# Patient Record
Sex: Female | Born: 1970 | Race: White | Hispanic: No | Marital: Married | Smoking: Never smoker
Health system: Southern US, Community
[De-identification: ages and names within clinical notes are randomized; demographics above are authoritative.]

## PROBLEM LIST (undated history)

## (undated) DIAGNOSIS — K760 Fatty (change of) liver, not elsewhere classified: Secondary | ICD-10-CM

## (undated) DIAGNOSIS — R05 Cough: Secondary | ICD-10-CM

## (undated) DIAGNOSIS — E669 Obesity, unspecified: Secondary | ICD-10-CM

## (undated) DIAGNOSIS — R059 Cough, unspecified: Secondary | ICD-10-CM

## (undated) DIAGNOSIS — I10 Essential (primary) hypertension: Secondary | ICD-10-CM

## (undated) HISTORY — DX: Essential (primary) hypertension: I10

## (undated) HISTORY — DX: Cough, unspecified: R05.9

## (undated) HISTORY — DX: Fatty (change of) liver, not elsewhere classified: K76.0

## (undated) HISTORY — DX: Cough: R05

## (undated) HISTORY — PX: PILONIDAL CYST EXCISION: SHX744

## (undated) HISTORY — PX: BARIATRIC SURGERY: SHX1103

## (undated) HISTORY — DX: Obesity, unspecified: E66.9

---

## 2003-11-14 ENCOUNTER — Other Ambulatory Visit: Admission: RE | Admit: 2003-11-14 | Discharge: 2003-11-14 | Payer: Self-pay | Admitting: Obstetrics & Gynecology

## 2004-02-08 ENCOUNTER — Other Ambulatory Visit: Admission: RE | Admit: 2004-02-08 | Discharge: 2004-02-08 | Payer: Self-pay | Admitting: Obstetrics & Gynecology

## 2004-08-27 ENCOUNTER — Other Ambulatory Visit: Admission: RE | Admit: 2004-08-27 | Discharge: 2004-08-27 | Payer: Self-pay | Admitting: Obstetrics & Gynecology

## 2005-09-05 ENCOUNTER — Encounter: Admission: RE | Admit: 2005-09-05 | Discharge: 2005-09-05 | Payer: Self-pay | Admitting: Obstetrics & Gynecology

## 2006-12-02 ENCOUNTER — Ambulatory Visit: Payer: Self-pay | Admitting: Family Medicine

## 2006-12-02 DIAGNOSIS — I10 Essential (primary) hypertension: Secondary | ICD-10-CM | POA: Insufficient documentation

## 2006-12-02 HISTORY — DX: Essential (primary) hypertension: I10

## 2006-12-07 ENCOUNTER — Encounter: Payer: Self-pay | Admitting: Family Medicine

## 2006-12-08 ENCOUNTER — Telehealth (INDEPENDENT_AMBULATORY_CARE_PROVIDER_SITE_OTHER): Payer: Self-pay | Admitting: *Deleted

## 2006-12-14 ENCOUNTER — Telehealth (INDEPENDENT_AMBULATORY_CARE_PROVIDER_SITE_OTHER): Payer: Self-pay | Admitting: *Deleted

## 2007-01-25 ENCOUNTER — Ambulatory Visit: Payer: Self-pay | Admitting: Family Medicine

## 2007-01-25 DIAGNOSIS — H6980 Other specified disorders of Eustachian tube, unspecified ear: Secondary | ICD-10-CM

## 2007-01-26 ENCOUNTER — Encounter: Payer: Self-pay | Admitting: Family Medicine

## 2007-01-26 LAB — CONVERTED CEMR LAB
BUN: 12 mg/dL (ref 6–23)
Calcium: 9.7 mg/dL (ref 8.4–10.5)
Chloride: 103 meq/L (ref 96–112)
Sodium: 139 meq/L (ref 135–145)

## 2007-06-11 ENCOUNTER — Encounter: Admission: RE | Admit: 2007-06-11 | Discharge: 2007-06-11 | Payer: Self-pay | Admitting: Family Medicine

## 2007-06-11 ENCOUNTER — Ambulatory Visit: Payer: Self-pay | Admitting: Family Medicine

## 2007-06-11 LAB — CONVERTED CEMR LAB
HCT: 34.2 % — ABNORMAL LOW (ref 36.0–46.0)
Hemoglobin: 11.3 g/dL — ABNORMAL LOW (ref 12.0–15.0)
Lymphs Abs: 3.2 10*3/uL (ref 0.7–4.0)
MCHC: 33.1 g/dL (ref 30.0–36.0)
Monocytes Absolute: 0.9 10*3/uL (ref 0.1–1.0)
Neutrophils Relative %: 65 % (ref 43–77)
Platelets: 608 10*3/uL — ABNORMAL HIGH (ref 150–400)
RBC: 4.7 M/uL (ref 3.87–5.11)
WBC: 11.8 10*3/uL — ABNORMAL HIGH (ref 4.0–10.5)

## 2007-07-02 ENCOUNTER — Ambulatory Visit: Payer: Self-pay | Admitting: Family Medicine

## 2007-07-02 ENCOUNTER — Telehealth: Payer: Self-pay | Admitting: Family Medicine

## 2007-08-31 ENCOUNTER — Telehealth: Payer: Self-pay | Admitting: Family Medicine

## 2007-09-14 ENCOUNTER — Ambulatory Visit: Payer: Self-pay | Admitting: Family Medicine

## 2007-10-04 ENCOUNTER — Telehealth: Payer: Self-pay | Admitting: Family Medicine

## 2007-11-02 ENCOUNTER — Ambulatory Visit: Payer: Self-pay | Admitting: Family Medicine

## 2008-02-02 ENCOUNTER — Ambulatory Visit: Payer: Self-pay | Admitting: Family Medicine

## 2008-02-02 LAB — CONVERTED CEMR LAB
AST: 50 units/L — ABNORMAL HIGH (ref 0–37)
Albumin: 4.2 g/dL (ref 3.5–5.2)
CO2: 20 meq/L (ref 19–32)
Calcium: 9.4 mg/dL (ref 8.4–10.5)
Chloride: 105 meq/L (ref 96–112)
Cholesterol: 187 mg/dL (ref 0–200)
Creatinine, Ser: 0.84 mg/dL (ref 0.40–1.20)
Glucose, Bld: 88 mg/dL (ref 70–99)
HDL: 51 mg/dL (ref >39)
Sodium: 138 meq/L (ref 135–145)
TSH: 2.009 microintl units/mL (ref 0.350–4.50)
Total CHOL/HDL Ratio: 3.7
Triglycerides: 64 mg/dL (ref <150)
VLDL: 13 mg/dL (ref 0–40)

## 2008-02-03 LAB — CONVERTED CEMR LAB
AST: 21 units/L (ref 0–37)
Calcium: 9.3 mg/dL (ref 8.4–10.5)
Glucose, Bld: 89 mg/dL (ref 70–99)
LDL Cholesterol: 121 mg/dL — ABNORMAL HIGH (ref 0–99)
Triglycerides: 101 mg/dL (ref <150)

## 2008-03-01 ENCOUNTER — Telehealth (INDEPENDENT_AMBULATORY_CARE_PROVIDER_SITE_OTHER): Payer: Self-pay | Admitting: *Deleted

## 2008-03-01 ENCOUNTER — Encounter: Payer: Self-pay | Admitting: Family Medicine

## 2008-08-01 ENCOUNTER — Ambulatory Visit: Payer: Self-pay | Admitting: Family Medicine

## 2008-08-01 DIAGNOSIS — K7689 Other specified diseases of liver: Secondary | ICD-10-CM

## 2008-08-01 DIAGNOSIS — F4322 Adjustment disorder with anxiety: Secondary | ICD-10-CM | POA: Insufficient documentation

## 2008-08-02 ENCOUNTER — Encounter: Payer: Self-pay | Admitting: Family Medicine

## 2008-08-02 DIAGNOSIS — E669 Obesity, unspecified: Secondary | ICD-10-CM

## 2008-08-09 LAB — CONVERTED CEMR LAB
HCV Ab: NEGATIVE
Hep B C IgM: NEGATIVE

## 2008-08-10 ENCOUNTER — Encounter: Admission: RE | Admit: 2008-08-10 | Discharge: 2008-08-10 | Payer: Self-pay | Admitting: Family Medicine

## 2009-01-31 ENCOUNTER — Ambulatory Visit: Payer: Self-pay | Admitting: Family Medicine

## 2009-01-31 DIAGNOSIS — R635 Abnormal weight gain: Secondary | ICD-10-CM | POA: Insufficient documentation

## 2009-02-27 ENCOUNTER — Encounter: Payer: Self-pay | Admitting: Family Medicine

## 2009-03-01 LAB — CONVERTED CEMR LAB
Alkaline Phosphatase: 104 units/L (ref 39–117)
Calcium: 9.4 mg/dL (ref 8.4–10.5)
Cholesterol: 175 mg/dL (ref 0–200)
Glucose, Bld: 87 mg/dL (ref 70–99)
Sodium: 139 meq/L (ref 135–145)
TSH: 2.677 microintl units/mL (ref 0.350–4.500)
Total Bilirubin: 0.4 mg/dL (ref 0.3–1.2)
Total CHOL/HDL Ratio: 3.5

## 2009-03-12 ENCOUNTER — Ambulatory Visit: Payer: Self-pay | Admitting: Family Medicine

## 2009-04-10 ENCOUNTER — Ambulatory Visit: Payer: Self-pay | Admitting: Family Medicine

## 2009-04-13 ENCOUNTER — Ambulatory Visit: Payer: Self-pay | Admitting: Family Medicine

## 2009-05-11 ENCOUNTER — Ambulatory Visit: Payer: Self-pay | Admitting: Family Medicine

## 2009-05-15 ENCOUNTER — Telehealth: Payer: Self-pay | Admitting: Family Medicine

## 2009-05-30 ENCOUNTER — Ambulatory Visit: Payer: Self-pay | Admitting: Family Medicine

## 2009-05-30 DIAGNOSIS — M546 Pain in thoracic spine: Secondary | ICD-10-CM

## 2009-06-07 ENCOUNTER — Encounter: Admission: RE | Admit: 2009-06-07 | Discharge: 2009-09-05 | Payer: Self-pay | Admitting: Family Medicine

## 2009-06-14 ENCOUNTER — Telehealth: Payer: Self-pay | Admitting: Family Medicine

## 2009-07-19 ENCOUNTER — Telehealth (INDEPENDENT_AMBULATORY_CARE_PROVIDER_SITE_OTHER): Payer: Self-pay | Admitting: *Deleted

## 2009-07-19 ENCOUNTER — Encounter: Payer: Self-pay | Admitting: Family Medicine

## 2009-07-23 ENCOUNTER — Ambulatory Visit: Payer: Self-pay | Admitting: Family Medicine

## 2009-07-25 ENCOUNTER — Encounter: Admission: RE | Admit: 2009-07-25 | Discharge: 2009-07-25 | Payer: Self-pay | Admitting: Obstetrics & Gynecology

## 2009-08-20 ENCOUNTER — Telehealth: Payer: Self-pay | Admitting: Family Medicine

## 2009-09-04 ENCOUNTER — Ambulatory Visit: Payer: Self-pay | Admitting: Family Medicine

## 2009-10-17 ENCOUNTER — Ambulatory Visit: Payer: Self-pay | Admitting: Family Medicine

## 2009-11-16 ENCOUNTER — Ambulatory Visit: Payer: Self-pay | Admitting: Family Medicine

## 2009-12-17 ENCOUNTER — Ambulatory Visit: Payer: Self-pay | Admitting: Family Medicine

## 2010-01-14 ENCOUNTER — Ambulatory Visit: Payer: Self-pay | Admitting: Family Medicine

## 2010-01-24 ENCOUNTER — Encounter: Payer: Self-pay | Admitting: Family Medicine

## 2010-01-24 ENCOUNTER — Ambulatory Visit: Payer: Self-pay | Admitting: Family Medicine

## 2010-03-05 NOTE — Miscellaneous (Signed)
Summary: PT Discharge/[facility]  PT Discharge/[facility]   Imported By: Lanelle Bal 07/31/2009 12:06:49  _____________________________________________________________________  External Attachment:    Type:   Image     Comment:   External Document

## 2010-03-05 NOTE — Assessment & Plan Note (Signed)
Summary: f/u WT/ BP   Vital Signs:  Patient profile:   40 year old female Height:      64.75 inches Weight:      198 pounds BMI:     33.32 O2 Sat:      98 % on Room air Pulse rate:   83 / minute BP sitting:   120 / 71  (left arm) Cuff size:   large  Vitals Entered By: Payton Spark CMA (October 17, 2009 10:38 AM)  O2 Flow:  Room air CC: F/U weight   Primary Care Karesha Trzcinski:  Seymour Bars DO  CC:  F/U weight.  History of Present Illness: 40 yo WF presents for f/u weight managment.  She has been on Phentermine for 7 months now and has lost a total of 50 lbs.  She finds that the medication has really helped her with snacking and controlling portion sizes and hunger.  her BP has remained excellent on Benicar.  She denies any adverse SEs.  She has been eating 3 meals / day with 2 healthy snacks.  She is  going to the gym 60-90 min 4-6 days/ wk.  She switched up to [employer]. She was doing elliptical, treadmill and weights.  She met with a trainer.  She feels well but always expects to 'lose more faster'.     Current Medications (verified): 1)  Benicar 20 Mg Tabs (Olmesartan Medoxomil) .Marland Kitchen.. 1 Tab By Mouth Daily 2)  Phentermine Hcl 37.5 Mg Caps (Phentermine Hcl) .Marland Kitchen.. 1 Capsule By Mouth Qam, 30 Min Before Breakfast 3)  Xyzal 5 Mg Tabs (Levocetirizine Dihydrochloride) .Marland Kitchen.. 1 Tab By Mouth Daily Pt Has Failed Claritin and Zyrtec  Allergies (verified): 1)  ! * Seasonal 2)  Ace Inhibitors  Past History:  Past Medical History: Reviewed history from 01/31/2009 and no changes required. obesity HTN G2P2 fatty liver dz  Mammogram 10-07 Pap 10-08 Dr Neal--> due for f/u  Social History: Reviewed history from 08/01/2008 and no changes required. 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 Never smoked. Occas. ETOH. Nuva Ring for birth control. Fair diet, little exercise.  Review of Systems      See HPI  Physical Exam  General:  alert,  well-developed, well-nourished, well-hydrated, and overweight-appearing.   Skin:  color normal.     Impression & Recommendations:  Problem # 1:  HYPERTENSION, BENIGN ESSENTIAL (ICD-401.1) BP at goal.  Continue Benicar.  Labs UTD.   Her updated medication list for this problem includes:    Benicar 20 Mg Tabs (Olmesartan medoxomil) .Marland Kitchen... 1 tab by mouth daily  BP today: 120/71 Prior BP: 121/79 (09/04/2009)  Prior 10 Yr Risk Heart Disease: 2 % (01/25/2007)  Labs Reviewed: K+: 4.8 (02/27/2009) Creat: : 0.99 (02/27/2009)   Chol: 175 (02/27/2009)   HDL: 50 (02/27/2009)   LDL: 104 (02/27/2009)   TG: 105 (02/27/2009)  Problem # 2:  OBESITY, UNSPECIFIED (ICD-278.00) BMI is down to 33 c/w class I obesity.  She has had a total of 50lbs lost in 7 mos on Phentermine with healthy diet and regular exercise. She wants to stay on it until her BMI drops to <30.  Reassed diet and exercise goals.    She has had complications of NAFLD and HTN. Will reasses LFTs in Jan and may be able to come off Benicar then.  Complete Medication List: 1)  Benicar 20 Mg Tabs (Olmesartan medoxomil) .Marland Kitchen.. 1 tab by mouth daily 2)  Phentermine Hcl 37.5 Mg Caps (Phentermine hcl) .Marland Kitchen.. 1 capsule by mouth qam, 30 min before breakfast 3)  Xyzal 5 Mg Tabs (Levocetirizine dihydrochloride) .Marland Kitchen.. 1 tab by mouth daily pt has failed claritin and zyrtec  Patient Instructions: 1)  Stay on Phentermine daily along with healthy (low carb) diet and exercise: 1 hr 5 days/ wk goal -- keep challenging yourself and keep up the good work! 2)  BP looks great! 3)  Nurse visits for WT/ BP checks in 1 month and 2 months. 4)  Return to see me in 3 mos.  (DEC) Prescriptions: PHENTERMINE HCL 37.5 MG CAPS (PHENTERMINE HCL) 1 capsule by mouth qAM, 30 min before breakfast  #30 x 0   Entered and Authorized by:   Seymour Bars DO   Signed by:   Seymour Bars DO on 10/17/2009   Method used:   Printed then faxed to ...       [facility]  [place] [phone][facility]  ([facility]       [address] [place], [place]  [address]       Ph: [phone] or [phone]       Fax: [phone]   RxID:   [id number]

## 2010-03-05 NOTE — Assessment & Plan Note (Signed)
Summary: weight   Vital Signs:  Patient profile:   40 year old female Height:      64.75 inches Weight:      248 pounds BMI:     41.74 O2 Sat:      99 % on Room air Temp:     98.2 degrees F oral Pulse rate:   88 / minute BP sitting:   143 / 90  (left arm) Cuff size:   large  Vitals Entered By: Payton Spark CMA (March 12, 2009 3:21 PM)  O2 Flow:  Room air CC: Discuss Weight loss   Primary Care Provider:  Seymour Bars DO  CC:  Discuss Weight loss.  History of Present Illness: 40 yo WF presents for f/u weight.  She did lose 6 lbs.  Cut out soda.   She is going to the gym 5 days/wk.  She is going 30 min of weights and 40 min on the elliptical.  She started going routinely 5 wks ago.    She is eating small more frequent meals.   She is eating more high protein.  She takes a frozen meal at lunch with rice cakes at lunch.  She making dinner at home.  She is eating dinner at 9 pm and going to bed at 10.  She is sleeping 7-8 hrs of sleep at night.  She feels well rested in the morning.    Her periods are regular on NuvaRing.   Current Medications (verified): 1)  Benicar 40 Mg Tabs (Olmesartan Medoxomil) .... 1/2 Tab By Mouth Daily 2)  Zyrtec-D Allergy & Congestion 5-120 Mg Xr12h-Tab (Cetirizine-Pseudoephedrine) .... Take 1 Tab By Mouth Once Daily 3)  Ibuprofen 200 Mg Tabs (Ibuprofen) .... 2-3 Tabs By Mouth Three Times A Day As Needed Pain  Allergies (verified): 1)  ! * Seasonal 2)  Ace Inhibitors  Past History:  Past Medical History: Reviewed history from 01/31/2009 and no changes required. obesity HTN G2P2 fatty liver dz  Mammogram 10-07 Pap 10-08 Dr Neal--> due for f/u  Social History: Reviewed history from 08/01/2008 and no changes required. 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 Never smoked. Occas. ETOH. Nuva Ring for birth control. Fair diet, little exercise.  Review of Systems      See HPI  Physical  Exam  General:  alert, well-developed, well-nourished, and well-hydrated.  obese Head:  normocephalic and atraumatic.   Mouth:  pharynx pink and moist.   Neck:  no masses.   Lungs:  Normal respiratory effort, chest expands symmetrically. Lungs are clear to auscultation, no crackles or wheezes. Heart:  Normal rate and regular rhythm. S1 and S2 normal without gallop, murmur, click, rub or other extra sounds. Neurologic:  no tremor Skin:  color normal.   Psych:  flat affect.     Impression & Recommendations:  Problem # 1:  FATTY LIVER DISEASE (ICD-571.8) LFTs mildly elevated in Jan. Copy given to pt.   Working of lifestyle changes. Start food diary.  Problem # 2:  HYPERTENSION, BENIGN ESSENTIAL (ICD-401.1) BP slightly higher today.  Continue Benicar.  Recheck in 1 month.   Her updated medication list for this problem includes:    Benicar 40 Mg Tabs (Olmesartan medoxomil) .Marland Kitchen... 1/2 tab by mouth daily  BP today: 143/90 Prior BP: 123/76 (01/31/2009)  Prior 10 Yr Risk Heart Disease: 2 % (01/25/2007)  Labs Reviewed: K+: 4.8 (02/27/2009) Creat: : 0.99 (02/27/2009)   Chol: 175 (02/27/2009)  HDL: 50 (02/27/2009)   LDL: 104 (02/27/2009)   TG: 105 (02/27/2009)  Problem # 3:  OBESITY, UNSPECIFIED (ICD-278.00) Congratulated her on 6 # wt loss.  Pt is disappointed with this given her exercise program over the past 5 wks. BMI is 41 c/w class III obesity with co-morbidities of HTN and fatty liver dz. 24hr food recall done.  She is eating her largest meal at 9 pm and going to bed an hr later. Discussed changing this to a bowl of special K at night and eating a larger meal at lunchtime. Continue 1 hr of exercise 5-6 hrs/ day and add RX Phentermine to help with wt loss. RTC 1 month for f/u wt/ BP.  Discussed goal wt loss of 1-2 lbs/ wk.  Complete Medication List: 1)  Benicar 40 Mg Tabs (Olmesartan medoxomil) .... 1/2 tab by mouth daily 2)  Zyrtec-d Allergy & Congestion 5-120 Mg Xr12h-tab  (Cetirizine-pseudoephedrine) .... Take 1 tab by mouth once daily 3)  Ibuprofen 200 Mg Tabs (Ibuprofen) .... 2-3 tabs by mouth three times a day as needed pain 4)  Phentermine Hcl 37.5 Mg Caps (Phentermine hcl) .Marland Kitchen.. 1 capsule by mouth qam, 30 min before breakfast  Patient Instructions: 1)  REturn for f/u weight mgmt in 1 month. 2)  Eat small dinner (like a bowl of special K at night). 3)  Start a Food diary. 4)  Continue 1 hr of exercise 5-6 days/ wk. Prescriptions: PHENTERMINE HCL 37.5 MG CAPS (PHENTERMINE HCL) 1 capsule by mouth qAM, 30 min before breakfast  #30 x 0   Entered and Authorized by:   Seymour Bars DO   Signed by:   Seymour Bars DO on 03/12/2009   Method used:   Reprint   RxID:   [id number] PHENTERMINE HCL 37.5 MG CAPS (PHENTERMINE HCL) 1 capsule by mouth qAM, 30 min before breakfast  #30 x 0   Entered and Authorized by:   Seymour Bars DO   Signed by:   Seymour Bars DO on 03/12/2009   Method used:   Printed then faxed to ...       [facility]  [place] [phone]       [address] [place], [place]  [address]       Ph: [phone] or [phone]       Fax: [phone]   RxID:   [id number]

## 2010-03-05 NOTE — Assessment & Plan Note (Signed)
Summary: WEIGHT BLOOD PRESS CK/DT  Nurse Visit   Vital Signs:  Patient profile:   40 year old female Weight:      230 pounds Pulse rate:   86 / minute BP sitting:   125 / 79  (left arm) Cuff size:   large  Allergies: 1)  ! * Seasonal 2)  Ace Inhibitors  Orders Added: 1)  Est. Patient Level I [616[address]]   CC: Follow-up HTN HPI: Taking meds? yes Side effects? no Chest pain, SOB, Dizziness? no A/P: HTN (401.1) At goal? yes.  If no, Patient will be notified.  5 minutes was spent with patient.

## 2010-03-05 NOTE — Progress Notes (Signed)
Summary: Phentermine refill and allergy med  Phone Note Refill Request   Refills Requested: Medication #1:  PHENTERMINE HCL 37.5 MG CAPS 1 capsule by mouth qAM Pt also c/o of seasonal allergies and would like Rx called in. She states she has tried OTC meds and does not get relief. Please advise.     Prescriptions: PHENTERMINE HCL 37.5 MG CAPS (PHENTERMINE HCL) 1 capsule by mouth qAM, 30 min before breakfast  #30 x 0   Entered and Authorized by:   Seymour Bars DO   Signed by:   Seymour Bars DO on 06/14/2009   Method used:   Printed then faxed to ...       [facility]  [place] [phone][facility] (retail)       [address] [place], [place]  [address]       Ph: [phone] or [phone]       Fax: [phone]   RxID:   [id number]  Need specifics on which OTC meds she has tried and failed for insurance to cover RX allergy meds.  Seymour Bars, D.O.  Appended Document: Phentermine refill and allergy med Pt states she has used zyrtec, nasonex, and claritin w/out any relief.  Appended Document: Phentermine refill and allergy med

## 2010-03-05 NOTE — Assessment & Plan Note (Signed)
Summary: BP and Wt check -  Nurse Visit   Vital Signs:  Patient profile:   40 year old female Height:      64.75 inches Weight:      206 pounds BMI:     34.67 Pulse rate:   77 / minute BP sitting:   121 / 79  (left arm) Cuff size:   large  Vitals Entered By: Payton Spark CMA (September 04, 2009 9:47 AM) CC: F/U weight and BP   Allergies: 1)  ! * Seasonal 2)  Ace Inhibitors  Orders Added: 1)  Est. Patient Level I [[address]] Prescriptions: PHENTERMINE HCL 37.5 MG CAPS (PHENTERMINE HCL) 1 capsule by mouth qAM, 30 min before breakfast  #30 x 0   Entered and Authorized by:   Seymour Bars DO   Signed by:   Seymour Bars DO on 09/04/2009   Method used:   Printed then faxed to ...       [facility]  [place] [phone][facility] (retail)       [address] [place], [place]  [address]       Ph: [phone] or [phone]       Fax: [phone]   RxID:   [id number]     Impression & Recommendations:  Problem # 1:  WEIGHT GAIN (ICD-783.1) Assessment Improved Wt down another 7 lbs in the past 6 mos on Phentermine. BP looks good. Will continue her for another month if she wishes. RTC in 1 month for WT/ BP check.  Complete Medication List: 1)  Benicar 20 Mg Tabs (Olmesartan medoxomil) .Marland Kitchen.. 1 tab by mouth daily 2)  Phentermine Hcl 37.5 Mg Caps (Phentermine hcl) .Marland Kitchen.. 1 capsule by mouth qam, 30 min before breakfast 3)  Xyzal 5 Mg Tabs (Levocetirizine dihydrochloride) .Marland Kitchen.. 1 tab by mouth daily pt has failed claritin and zyrtec   Appended Document: BP and Wt check - refill faxed

## 2010-03-05 NOTE — Progress Notes (Signed)
Summary: Phentermine refill  Phone Note Refill Request   Refills Requested: Medication #1:  PHENTERMINE HCL 37.5 MG CAPS 1 capsule by mouth qAM Initial call taken by: Payton Spark CMA,  May 15, 2009 8:06 AM    Prescriptions: PHENTERMINE HCL 37.5 MG CAPS (PHENTERMINE HCL) 1 capsule by mouth qAM, 30 min before breakfast  #30 x 0   Entered and Authorized by:   Seymour Bars DO   Signed by:   Seymour Bars DO on 05/15/2009   Method used:   Printed then faxed to ...       [facility]  [place] [phone][facility] (retail)       [address] [place], [place]  [address]       Ph: [phone] or [phone]       Fax: [phone]   RxID:   [id number]

## 2010-03-05 NOTE — Assessment & Plan Note (Signed)
Summary: thoracic muscle spasm   Vital Signs:  Patient profile:   40 year old female Height:      64.75 inches Weight:      224 pounds BMI:     37.70 O2 Sat:      100 % on Room air Pulse rate:   93 / minute BP sitting:   106 / 74  (left arm) Cuff size:   large  Vitals Entered By: Payton Spark CMA (May 30, 2009 10:29 AM)  O2 Flow:  Room air CC: Upper mid back pain x 2 weeks- No known injuy. Also c/o allergies and itchy dry eyes. OTC meds not helping.   Primary Care Provider:  Seymour Bars DO  CC:  Upper mid back pain x 2 weeks- No known injuy. Also c/o allergies and itchy dry eyes. OTC meds not helping.Marland Kitchen  History of Present Illness: 40 yo WF presents for R sided thoracic pain x 2 wks.  It hurts mildly during the day and is worse at night.  She has been back to the gym and has been doing some upper body lifting.  She doesn't recall an injury at home, work or at [employer]  Ibuprofen has helped just a little bit.  No previous problems like this.  Able to do her ADLs and get thru workday w/o problem.  Current Medications (verified): 1)  Benicar 40 Mg Tabs (Olmesartan Medoxomil) .... 1/2 Tab By Mouth Daily 2)  Phentermine Hcl 37.5 Mg Caps (Phentermine Hcl) .Marland Kitchen.. 1 Capsule By Mouth Qam, 30 Min Before Breakfast 3)  Zyrtec-D Allergy & Congestion 5-120 Mg Xr12h-Tab (Cetirizine-Pseudoephedrine) .... Take 1 Tab By Mouth Once Daily  Allergies (verified): 1)  ! * Seasonal 2)  Ace Inhibitors  Past History:  Past Medical History: Reviewed history from 01/31/2009 and no changes required. obesity HTN G2P2 fatty liver dz  Mammogram 10-07 Pap 10-08 Dr Neal--> due for f/u  Social History: Reviewed history from 08/01/2008 and no changes required. 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 Never smoked. Occas. ETOH. Nuva Ring for birth control. Fair diet, little exercise.  Review of Systems      See HPI  Physical Exam  General:  alert,  well-developed, well-nourished, and well-hydrated.  obese Head:  normocephalic and atraumatic.   Neck:  no masses.   Lungs:  Normal respiratory effort, chest expands symmetrically. Lungs are clear to auscultation, no crackles or wheezes. Heart:  Normal rate and regular rhythm. S1 and S2 normal without gallop, murmur, click, rub or other extra sounds. Msk:  L sided thoracic muscle fullness and tenderness along medial scapular border full L and T spine ROM full glenohumeral ROM Extremities:  no UE or LE edema Skin:  color normal.   Psych:  good eye contact, not anxious appearing, and not depressed appearing.     Impression & Recommendations:  Problem # 1:  BACK PAIN, THORACIC REGION, RIGHT (ICD-724.1) R thoracic spasm possibly with new onset weight lifting.  OK to do bicept/ tricepts but avoid other upper body weights x 2-3 wks. Treat with Cylcobenzaprine at night, RX NSAIDs, icy hot, PT and massage. Expect improvments in the next 2-3 wks. Her updated medication list for this problem includes:    Cyclobenzaprine Hcl 10 Mg Tabs (Cyclobenzaprine hcl) .Marland Kitchen... 1 tab by mouth at bedtime as needed muscle spasm    Diclofenac Sodium 50 Mg Tbec (Diclofenac sodium) .Marland Kitchen... 1 tab by mouth two times a day  with food x 10 days  Orders: Physical Therapy Referral (PT)  Problem # 2:  OBESITY, UNSPECIFIED (ICD-278.00) Weight down another 6 lbs with Phentermine + diet and exercise.  Doing great!  Complete Medication List: 1)  Benicar 40 Mg Tabs (Olmesartan medoxomil) .... 1/2 tab by mouth daily 2)  Phentermine Hcl 37.5 Mg Caps (Phentermine hcl) .Marland Kitchen.. 1 capsule by mouth qam, 30 min before breakfast 3)  Zyrtec-d Allergy & Congestion 5-120 Mg Xr12h-tab (Cetirizine-pseudoephedrine) .... Take 1 tab by mouth once daily 4)  Cyclobenzaprine Hcl 10 Mg Tabs (Cyclobenzaprine hcl) .Marland Kitchen.. 1 tab by mouth at bedtime as needed muscle spasm 5)  Diclofenac Sodium 50 Mg Tbec (Diclofenac sodium) .Marland Kitchen.. 1 tab by mouth two times a  day with food x 10 days  Patient Instructions: 1)  Try Icy Hot over muscle spasm, R thoracic region. 2)  Treat with RX anti-inflammatories- Etodolac with breakfast and dinner x 10 days. 3)  Use cylcobenzaprine at night (muscle relaxer). 4)  Refer to PT down the hall. 5)  OK to continue exercise as directed. 6)  Massage is OK to add. 7)  If not improved in 3 wks, please call. Prescriptions: DICLOFENAC SODIUM 50 MG TBEC (DICLOFENAC SODIUM) 1 tab by mouth two times a day with food x 10 days  #20 x 0   Entered and Authorized by:   Seymour Bars DO   Signed by:   Seymour Bars DO on 05/30/2009   Method used:   Electronically to        [facility] [phone]* (retail)       [address] [place], [place]  [address]       Ph: [phone] or [phone]       Fax: [phone]   RxID:   [id number] CYCLOBENZAPRINE HCL 10 MG TABS (CYCLOBENZAPRINE HCL) 1 tab by mouth at bedtime as needed muscle spasm  #15 x 0   Entered and Authorized by:   Seymour Bars DO   Signed by:   Seymour Bars DO on 05/30/2009   Method used:   Electronically to        [facility] [phone]* (retail)       [address] [place], [place]  [address]       Ph: [phone] or [phone]       Fax: [phone]   RxID:   [id number]

## 2010-03-05 NOTE — Assessment & Plan Note (Signed)
Summary: f/u weight   Vital Signs:  Patient profile:   40 year old female Height:      64.75 inches Weight:      235 pounds BMI:     39.55 O2 Sat:      96 % on Room air Temp:     98.4 degrees F oral Pulse rate:   84 / minute BP sitting:   130 / 80  (left arm) Cuff size:   large  Vitals Entered By: Payton Spark CMA (April 13, 2009 9:12 AM)  O2 Flow:  Room air CC: F/U weight.    Primary Care Provider:  Seymour Bars DO  CC:  F/U weight. .  History of Present Illness: 40 yo WF presents for f/u weight managment.  She is doing well on Phentermine which started about 3 mos ago.  It has curbed her appetite.  Her portion sizes are smaller and she is snacking less.  She is eating healthier and exercising 90 min 5 days/ wk.  Her energy level is improving.  She is sleeping well at night.  Denies tremor, insomnia or heart palpitation.    Allergies: 1)  ! * Seasonal 2)  Ace Inhibitors  Past History:  Past Medical History: Reviewed history from 01/31/2009 and no changes required. obesity HTN G2P2 fatty liver dz  Mammogram 10-07 Pap 10-08 Dr Neal--> due for f/u  Social History: Reviewed history from 08/01/2008 and no changes required. 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 Never smoked. Occas. ETOH. Nuva Ring for birth control. Fair diet, little exercise.  Review of Systems      See HPI  Physical Exam  General:  obese WF in NAD Head:  normocephalic and atraumatic.   Neck:  no thyromegaly.   Lungs:  Normal respiratory effort, chest expands symmetrically. Lungs are clear to auscultation, no crackles or wheezes. Heart:  Normal rate and regular rhythm. S1 and S2 normal without gallop, murmur, click, rub or other extra sounds. Neurologic:  no tremor Skin:  color normal.   Psych:  good eye contact, not anxious appearing, and flat affect.     Impression & Recommendations:  Problem # 1:  FATTY LIVER DISEASE (ICD-571.8) Plan to  recheck LFTs once BMI drops below 35.  Problem # 2:  HYPERTENSION, BENIGN ESSENTIAL (ICD-401.1) Assessment: Improved  Her updated medication list for this problem includes:    Benicar 40 Mg Tabs (Olmesartan medoxomil) .Marland Kitchen... 1/2 tab by mouth daily  BP today: 130/80 Prior BP: 143/90 (03/12/2009)  Prior 10 Yr Risk Heart Disease: 2 % (01/25/2007)  Labs Reviewed: K+: 4.8 (02/27/2009) Creat: : 0.99 (02/27/2009)   Chol: 175 (02/27/2009)   HDL: 50 (02/27/2009)   LDL: 104 (02/27/2009)   TG: 105 (02/27/2009)  Problem # 3:  OBESITY, UNSPECIFIED (ICD-278.00) Obesity complicated by HTN and fatty liver dz. She has done great with another 13# in the past month with phentermine, healthy diet and regular exercise and NO adverse SEs. We reviewed her food intake, exercise history and short term goals with positive reinforcement.  Complete Medication List: 1)  Benicar 40 Mg Tabs (Olmesartan medoxomil) .... 1/2 tab by mouth daily 2)  Phentermine Hcl 37.5 Mg Caps (Phentermine hcl) .Marland Kitchen.. 1 capsule by mouth qam, 30 min before breakfast  Patient Instructions: 1)  REturn for a nurse visit in 1 month - weight/ BP check. 2)  Return to see me in 3 months. Prescriptions: PHENTERMINE HCL 37.5 MG CAPS (  PHENTERMINE HCL) 1 capsule by mouth qAM, 30 min before breakfast  #30 x 0   Entered and Authorized by:   Seymour Bars DO   Signed by:   Seymour Bars DO on 04/13/2009   Method used:   Printed then faxed to ...       [facility]  [place] [phone][facility] (retail)       [address] [place], [place]  [address]       Ph: [phone] or [phone]       Fax: [phone]   RxID:   [id number]

## 2010-03-05 NOTE — Assessment & Plan Note (Signed)
Summary: BP/ WT check  Nurse Visit   Vital Signs:  Patient profile:   40 year old female Height:      64.75 inches Weight:      196 pounds BMI:     32.99 Pulse rate:   67 / minute BP sitting:   116 / 74  (right arm) Cuff size:   large  Vitals Entered By: Avon Gully CMA, Duncan Dull) (November 16, 2009 9:47 AM)  Impression & Recommendations:  Problem # 1:  WEIGHT GAIN (ICD-783.1) Wt lost in the past month: 2 # on Phentermine.  BP looks great. Will not RF since it appears that the effects of medication are wearing off. Aim for 1 hr of vigorous exercise 5-6 days / wk and 1200 kcal/ day dietary intake.  Complete Medication List: 1)  Benicar 20 Mg Tabs (Olmesartan medoxomil) .Marland Kitchen.. 1 tab by mouth daily 2)  Phentermine Hcl 37.5 Mg Caps (Phentermine hcl) .Marland Kitchen.. 1 capsule by mouth qam, 30 min before breakfast 3)  Xyzal 5 Mg Tabs (Levocetirizine dihydrochloride) .Marland Kitchen.. 1 tab by mouth daily pt has failed claritin and zyrtec   Primary Care Provider:  Seymour Bars DO   History of Present Illness: BP and Wt check    Allergies: 1)  ! * Seasonal 2)  Ace Inhibitors  Orders Added: 1)  Est. Patient Level I [[address]08]  Appended Document: BP/ WT check 11/17/09 acm 4:36  called and left pt a message with Dr. Misty Stanley reccomendations

## 2010-03-05 NOTE — Assessment & Plan Note (Signed)
Summary: WT check  Nurse Visit   Vital Signs:  Patient profile:   40 year old female Height:      64.75 inches Weight:      196 pounds BMI:     32.99 O2 Sat:      98 % on Room air Pulse rate:   86 / minute BP sitting:   116 / 74  (left arm) Cuff size:   large  Vitals Entered By: Payton Spark CMA (December 17, 2009 9:35 AM)  O2 Flow:  Room air CC: Weight and BP check. Pt feels she did better w/ phentermine and would like refill   Allergies (verified): 1)  ! * Seasonal 2)  Ace Inhibitors  Orders Added: 1)  Est. Patient Level I [10272] Prescriptions: PHENTERMINE HCL 37.5 MG CAPS (PHENTERMINE HCL) 1 capsule by mouth qAM, 30 min before breakfast  #30 x 0   Entered and Authorized by:   Seymour Bars DO   Signed by:   Seymour Bars DO on 12/17/2009   Method used:   Printed then faxed to ...       [facility] [phone][facility] (retail)       [address] [place], [place]  [address]       Ph: [phone] or [phone]       Fax: [phone]   RxID:   [id number]     Impression & Recommendations:  Problem # 1:  WEIGHT GAIN (ICD-783.1) Wt stable but not losing off Phentermine.  Had stopped it due to lack of adequate response over time. I will restart her Phentermine -- take it 5 days a wk to help prevent tolerance + healthy diet + regular exercise.  RTC for nurse visit in 5 wks -- check BP/ WT and do EKG.    Complete Medication List: 1)  Benicar 20 Mg Tabs (Olmesartan medoxomil) .Marland Kitchen.. 1 tab by mouth daily 2)  Phentermine Hcl 37.5 Mg Caps (Phentermine hcl) .Marland Kitchen.. 1 capsule by mouth qam, 30 min before breakfast 3)  Xyzal 5 Mg Tabs (Levocetirizine dihydrochloride) .Marland Kitchen.. 1 tab by mouth daily pt has failed claritin and zyrtec   Appended Document: WT check LMOM informing Pt of the above

## 2010-03-05 NOTE — Progress Notes (Signed)
Summary: Phentermine refill  Phone Note Refill Request   Refills Requested: Medication #1:  PHENTERMINE HCL 37.5 MG CAPS 1 capsule by mouth qAM Pt states when she came in for last weight check she was not due for refill until 7/16.      Prescriptions: PHENTERMINE HCL 37.5 MG CAPS (PHENTERMINE HCL) 1 capsule by mouth qAM, 30 min before breakfast  #30 x 0   Entered and Authorized by:   Seymour Bars DO   Signed by:   Seymour Bars DO on 08/20/2009   Method used:   Printed then faxed to ...       [facility]  [place] [phone]* (retail)       [address] [place], [place]  [address]       Ph: [phone] or [phone]       Fax: [phone]   RxID:   [id number]

## 2010-03-05 NOTE — Progress Notes (Signed)
  Phone Note Refill Request   Refills Requested: Medication #1:  PHENTERMINE HCL 37.5 MG CAPS 1 capsule by mouth qAM Initial call taken by: Payton Spark CMA,  July 19, 2009 8:45 AM    Prescriptions: PHENTERMINE HCL 37.5 MG CAPS (PHENTERMINE HCL) 1 capsule by mouth qAM, 30 min before breakfast  #30 x 0   Entered by:   Payton Spark CMA   Authorized by:   Seymour Bars DO   Signed by:   Payton Spark CMA on 07/19/2009   Method used:   Telephoned to ...       [facility]  [place] [phone][facility] (retail)       [address] [place], [place]  [address]       Ph: [phone] or [phone]       Fax: [phone]   RxID:   [id number]

## 2010-03-05 NOTE — Miscellaneous (Signed)
Summary: PT Initial Summary/[facility]  PT Initial Summary/   Imported By: Lanelle Bal 07/31/2009 12:05:37  _____________________________________________________________________  External Attachment:    Type:   Image     Comment:   External Document

## 2010-03-05 NOTE — Assessment & Plan Note (Signed)
Summary: f/u weight mgmt   Vital Signs:  Patient profile:   40 year old female Height:      64.75 inches Weight:      213 pounds BMI:     35.85 Pulse rate:   91 / minute BP sitting:   127 / 77  (left arm) Cuff size:   large  Vitals Entered By: Kathlene November (July 23, 2009 9:55 AM) CC: weight check with BP check   Primary Care Provider:  Seymour Bars DO  CC:  weight check with BP check.  History of Present Illness: 40 yo WF presents for f/u weight and BP.  She is doing well with Phentermine once a day.  She is on Benicar 20 mg/ day for her BP.  She has been continuing to eat healthy and is losing 1-2 lbs per wk.  She is exercising 4 days/ wk.  She feels good about her weight. She denies tremor, heart palptations or insomnia.    Current Medications (verified): 1)  Benicar 40 Mg Tabs (Olmesartan Medoxomil) .... 1/2 Tab By Mouth Daily 2)  Phentermine Hcl 37.5 Mg Caps (Phentermine Hcl) .Marland Kitchen.. 1 Capsule By Mouth Qam, 30 Min Before Breakfast 3)  Xyzal 5 Mg Tabs (Levocetirizine Dihydrochloride) .Marland Kitchen.. 1 Tab By Mouth Daily Pt Has Failed Claritin and Zyrtec  Allergies (verified): 1)  ! * Seasonal 2)  Ace Inhibitors  Comments:  Nurse/Medical Assistant: The patient's medications and allergies were reviewed with the patient and were updated in the Medication and Allergy Lists. Kathlene November (July 23, 2009 9:55 AM)  Past History:  Past Medical History: Reviewed history from 01/31/2009 and no changes required. obesity HTN G2P2 fatty liver dz  Mammogram 10-07 Pap 10-08 Dr Neal--> due for f/u  Social History: Reviewed history from 08/01/2008 and no changes required. 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 Never smoked. Occas. ETOH. Nuva Ring for birth control. Fair diet, little exercise.  Review of Systems      See HPI  Physical Exam  General:  alert, well-developed, well-nourished, and well-hydrated.  obese Head:  normocephalic and  atraumatic.   Neck:  no masses.   Lungs:  Normal respiratory effort, chest expands symmetrically. Lungs are clear to auscultation, no crackles or wheezes. Heart:  Normal rate and regular rhythm. S1 and S2 normal without gallop, murmur, click, rub or other extra sounds. Neurologic:  no tremor Skin:  color normal.   Psych:  good eye contact, not anxious appearing, and not depressed appearing.     Impression & Recommendations:  Problem # 1:  HYPERTENSION, BENIGN ESSENTIAL (ICD-401.1) BP looks great on Benicar.  Continue. Her updated medication list for this problem includes:    Benicar 20 Mg Tabs (Olmesartan medoxomil) .Marland Kitchen... 1 tab by mouth daily  BP today: 127/77 Prior BP: 106/74 (05/30/2009)  Prior 10 Yr Risk Heart Disease: 2 % (01/25/2007)  Labs Reviewed: K+: 4.8 (02/27/2009) Creat: : 0.99 (02/27/2009)   Chol: 175 (02/27/2009)   HDL: 50 (02/27/2009)   LDL: 104 (02/27/2009)   TG: 105 (02/27/2009)  Problem # 2:  OBESITY, UNSPECIFIED (ICD-278.00) Wt down another 11 lbs with healthy diet, regular exercise and Phentermine. She is doing well on the medication w/o adverse SE.   BMI is 35 c/w class II obesity.  She has co-morbidities of HTN and NAFLD.   RTC for a nurse visit in 6 wks.  Problem # 3:  FATTY LIVER DISEASE (ICD-571.8) Mildly elevated LFTs  in Jan. Will try to get her down <200 lbs prior to repeating LFTs.  Complete Medication List: 1)  Benicar 20 Mg Tabs (Olmesartan medoxomil) .Marland Kitchen.. 1 tab by mouth daily 2)  Phentermine Hcl 37.5 Mg Caps (Phentermine hcl) .Marland Kitchen.. 1 capsule by mouth qam, 30 min before breakfast 3)  Xyzal 5 Mg Tabs (Levocetirizine dihydrochloride) .Marland Kitchen.. 1 tab by mouth daily pt has failed claritin and zyrtec  Patient Instructions: 1)  Next RF due on the 16th. 2)  Keep up the good work! 3)  Return for a nurse visit BP/ Wt check in 6 wks. Prescriptions: BENICAR 20 MG TABS (OLMESARTAN MEDOXOMIL) 1 tab by mouth daily  #30 x 5   Entered and Authorized by:   Seymour Bars DO   Signed by:   Seymour Bars DO on 07/23/2009   Method used:   Electronically to        [facility] [phone]* (retail)       [address] [place], [place]  [address]       Ph: [phone] or [phone]       Fax: [phone]   RxID:   [id number]

## 2010-03-07 NOTE — Assessment & Plan Note (Signed)
Summary: f/u WT   Vital Signs:  Patient profile:   40 year old female Height:      64.75 inches Weight:      191 pounds BMI:     32.15 O2 Sat:      100 % on Room air Pulse rate:   76 / minute BP sitting:   111 / 71  (left arm) Cuff size:   large  Vitals Entered By: Payton Spark CMA (January 24, 2010 10:04 AM)  O2 Flow:  Room air CC: F/U weight and HTN   CC:  F/U weight and HTN.  Allergies: 1)  ! * Seasonal 2)  Ace Inhibitors   Impression & Recommendations:  Problem # 1:  WEIGHT GAIN (ICD-783.1) Assessment Improved Wynema is down another 5 lbs with adding Phentermine back 1 month ago.   Her BP looks great and her EKG shows NSR at 72 bpm, NSR, no arrythmia, QTc 442 ms, no ischemia. Will RF her RX for another month since she is doing great.  Has choice to continue 5 days/ wk.   Nurse visit in 1 month. OV in 2 mos.  BMI 32 = class I obesity with complication of HTN and fatty liver dz.  Complete Medication List: 1)  Benicar 20 Mg Tabs (Olmesartan medoxomil) .Marland Kitchen.. 1 tab by mouth daily 2)  Phentermine Hcl 37.5 Mg Caps (Phentermine hcl) .Marland Kitchen.. 1 capsule by mouth qam, 30 min before breakfast 3)  Xyzal 5 Mg Tabs (Levocetirizine dihydrochloride) .Marland Kitchen.. 1 tab by mouth daily pt has failed claritin and zyrtec  Other Orders: EKG w/ Interpretation (93000) Prescriptions: BENICAR 20 MG TABS (OLMESARTAN MEDOXOMIL) 1 tab by mouth daily  #30 x 5   Entered and Authorized by:   Seymour Bars DO   Signed by:   Seymour Bars DO on 01/24/2010   Method used:   Electronically to        [facility] [phone]* (retail)       [address] [place], [place]  [address]       Ph: [phone] or [phone]       Fax: [phone]   RxID:   [id number] PHENTERMINE HCL 37.5 MG CAPS (PHENTERMINE HCL) 1 capsule by mouth qAM, 30 min before breakfast  #30 x 0   Entered and Authorized by:   Seymour Bars DO   Signed by:   Seymour Bars DO on 01/24/2010   Method used:   Printed then faxed to ...       [facility]  [place] [phone][facility] (retail)       [address] [place], [place]  [address]       Ph: [phone] or [phone]       Fax: [phone]   RxID:   [id number]    Orders Added: 1)  Est. Patient Level I [42706] 2)  EKG w/ Interpretation [93000]     CC: Follow-up HTN HPI: Taking meds? yes   Side effects? no Chest pain, SOB, Dizziness? no   A/P: HTN (401.1) At goal? If no, Patient will be notified.  5 minutes was spent with patient.

## 2010-03-11 ENCOUNTER — Encounter: Payer: Self-pay | Admitting: Family Medicine

## 2010-03-11 ENCOUNTER — Ambulatory Visit (INDEPENDENT_AMBULATORY_CARE_PROVIDER_SITE_OTHER): Payer: 59 | Admitting: Family Medicine

## 2010-03-11 DIAGNOSIS — L2089 Other atopic dermatitis: Secondary | ICD-10-CM | POA: Insufficient documentation

## 2010-03-11 DIAGNOSIS — I10 Essential (primary) hypertension: Secondary | ICD-10-CM

## 2010-03-11 DIAGNOSIS — E669 Obesity, unspecified: Secondary | ICD-10-CM

## 2010-03-14 ENCOUNTER — Encounter: Payer: Self-pay | Admitting: Family Medicine

## 2010-03-15 LAB — CONVERTED CEMR LAB
ALT: 29 units/L (ref 0–35)
AST: 34 units/L (ref 0–37)
BUN: 16 mg/dL (ref 6–23)
Calcium: 9.2 mg/dL (ref 8.4–10.5)
Chloride: 103 meq/L (ref 96–112)
Creatinine, Ser: 1.18 mg/dL (ref 0.40–1.20)
HDL: 46 mg/dL (ref >39)
LDL Cholesterol: 156 mg/dL — ABNORMAL HIGH (ref 0–99)
Sodium: 137 meq/L (ref 135–145)
Total Protein: 7.2 g/dL (ref 6.0–8.3)
Triglycerides: 99 mg/dL (ref <150)
VLDL: 20 mg/dL (ref 0–40)

## 2010-03-21 NOTE — Assessment & Plan Note (Signed)
Summary: itchy rash/ WT/ BP   Vital Signs:  Patient profile:   40 year old female Height:      64.75 inches Weight:      188 pounds BMI:     31.64 O2 Sat:      99 % on Room air Temp:     98.4 degrees F oral Pulse rate:   84 / minute BP sitting:   134 / 88  (left arm) Cuff size:   large  Vitals Entered By: Payton Spark CMA (March 11, 2010 3:27 PM)  O2 Flow:  Room air CC: Both arms and eyes very itchy   Primary Care Provider:  Seymour Bars DO  CC:  Both arms and eyes very itchy.  History of Present Illness: 40 yo WF presents for f/u HTN.  She has been taking her Benicar but did forget it for a few days last wk.  She is having itchy over both forearms and under her eyes over the weekend.  She doesn't think that she was exposed to any chemicals or allergens.  She denies a hx of eczema.  She has been scratching a lot.  Denies any itching or watering of her actual eyes.  Denies any rash or itching elsewhere on her body.    She is due for labs. Denies any chest pain, palpitations, SOB or leg swelling.    Current Medications (verified): 1)  Benicar 20 Mg Tabs (Olmesartan Medoxomil) .Marland Kitchen.. 1 Tab By Mouth Daily 2)  Xyzal 5 Mg Tabs (Levocetirizine Dihydrochloride) .Marland Kitchen.. 1 Tab By Mouth Daily Pt Has Failed Claritin and Zyrtec  Allergies (verified): 1)  ! * Seasonal 2)  Ace Inhibitors  Past History:  Past Medical History: Reviewed history from 01/31/2009 and no changes required. obesity HTN G2P2 fatty liver dz  Mammogram 10-07 Pap 10-08 Dr Neal--> due for f/u  Social History: Reviewed history from 08/01/2008 and no changes required. 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 Never smoked. Occas. ETOH. Nuva Ring for birth control. Fair diet, little exercise.  Review of Systems      See HPI  Physical Exam  General:  alert, well-developed, well-nourished, well-hydrated, and overweight-appearing.   Head:  normocephalic and  atraumatic.   Eyes:  conjunctiva clear; no lid edema Mouth:  pharynx pink and moist.  no lesions of the oral mucosa Neck:  no masses.   Lungs:  Normal respiratory effort, chest expands symmetrically. Lungs are clear to auscultation, no crackles or wheezes. Heart:  Normal rate and regular rhythm. S1 and S2 normal without gallop, murmur, click, rub or other extra sounds. Extremities:  no LE edema Skin:  color normal.  excorations with xerotic skin and atopc dermatitis both forerams and under both eyes.   Cervical Nodes:  No lymphadenopathy noted Psych:  good eye contact, not anxious appearing, and not depressed appearing.     Impression & Recommendations:  Problem # 1:  HYPERTENSION, BENIGN ESSENTIAL (ICD-401.1) BP OK.  Has RFs on meds, will continue.  Update labs. Her updated medication list for this problem includes:    Benicar 20 Mg Tabs (Olmesartan medoxomil) .Marland Kitchen... 1 tab by mouth daily  BP today: 134/88 Prior BP: 111/71 (01/24/2010)  Prior 10 Yr Risk Heart Disease: 2 % (01/25/2007)  Labs Reviewed: K+: 4.8 (02/27/2009) Creat: : 0.99 (02/27/2009)   Chol: 175 (02/27/2009)   HDL: 50 (02/27/2009)   LDL: 104 (02/27/2009)   TG: 105 (02/27/2009)  Problem #  2:  OBESITY, UNSPECIFIED (ICD-278.00) BMI 31 c/w class I obesity, wt down another 3 lbs off Phentermine.  Doing well but she would like to continue to work on wt loss and notices an increase in appetitie off the medicine.  She is working on diet/ exercise. Will RF Phentermine along with diet/ exercise to use 5 days/ wk.  Recheck WT/ BP in 2 mos. BP/ HR OK today. Complications include HTN and NAFLD.  Problem # 3:  DERMATITIS, ATOPIC (ICD-691.8) Will treat with Elocon cream x 10 days.  Call if not improving.  See pt instructions for the rest of her supportive care instructions. Her updated medication list for this problem includes:    Mometasone Furoate 0.1 % Crea (Mometasone furoate) .Marland Kitchen... Apply to rash daily as directed x 10  days  Complete Medication List: 1)  Benicar 20 Mg Tabs (Olmesartan medoxomil) .Marland Kitchen.. 1 tab by mouth daily 2)  Xyzal 5 Mg Tabs (Levocetirizine dihydrochloride) .Marland Kitchen.. 1 tab by mouth daily pt has failed claritin and zyrtec 3)  Mometasone Furoate 0.1 % Crea (Mometasone furoate) .... Apply to rash daily as directed x 10 days 4)  Phentermine Hcl 37.5 Mg Tabs (Phentermine hcl) .Marland Kitchen.. 1 tab by mouth qam; take 30 min before breakfast  Other Orders: T-Lipid Profile ([address]-60454) T-Comprehensive Metabolic Panel ([address]11-91478)  Patient Instructions: 1)  Start Mometasone cream for atopic dermatitis - use once daily on forearms and under eyes.  2)  Use oral Benadyl at night to prevent scratching. 3)  Bathe with Dove unscented soap. 4)  Call if rash/ itching not resolved in 10 days. 5)  Update fasting labs downstairs one morning.  Will call you w/ results. 6)  Restart Phentermine 4-5 days/ wk along with healthy diet and regular exercise.  7)  REturn for nurse BP/ WT check in 2 mos. Prescriptions: PHENTERMINE HCL 37.5 MG TABS (PHENTERMINE HCL) 1 tab by mouth qAM; take 30 min before breakfast  #30 x 0   Entered and Authorized by:   Seymour Bars DO   Signed by:   Seymour Bars DO on 03/11/2010   Method used:   Printed then faxed to ...       [facility]  [place] [phone]* (retail)       [address] [place], [place]  [address]       Ph: [phone] or [phone]       Fax: [phone]   RxID:   [id number] MOMETASONE FUROATE 0.1 % CREA (MOMETASONE FUROATE) apply to rash daily as directed x 10 days  #15 g x 0   Entered and Authorized by:   Seymour Bars DO   Signed by:   Seymour Bars DO on 03/11/2010   Method used:   Electronically to        [facility] [phone]* (retail)       [address] [place], [place]  [address]       Ph: [phone] or [phone]       Fax: [phone]   RxID:   [id number]    Orders Added: 1)  T-Lipid Profile [id number] 2)  T-Comprehensive Metabolic Panel  [[id number]] 3)  Est. Patient Level IV [85462]

## 2010-05-30 ENCOUNTER — Ambulatory Visit (INDEPENDENT_AMBULATORY_CARE_PROVIDER_SITE_OTHER): Payer: 59 | Admitting: Family Medicine

## 2010-05-30 ENCOUNTER — Encounter: Payer: Self-pay | Admitting: Family Medicine

## 2010-05-30 DIAGNOSIS — J309 Allergic rhinitis, unspecified: Secondary | ICD-10-CM | POA: Insufficient documentation

## 2010-05-30 DIAGNOSIS — E785 Hyperlipidemia, unspecified: Secondary | ICD-10-CM

## 2010-05-30 DIAGNOSIS — E669 Obesity, unspecified: Secondary | ICD-10-CM

## 2010-05-30 MED ORDER — FLUTICASONE PROPIONATE 50 MCG/ACT NA SUSP: 1.0000 | Freq: Every day | NASAL | Status: DC

## 2010-05-30 MED ORDER — ROSUVASTATIN CALCIUM 10 MG PO TABS: 10.0000 mg | ORAL_TABLET | Freq: Every day | ORAL | Status: DC

## 2010-05-30 MED ORDER — PHENTERMINE HCL 37.5 MG PO CAPS: 37.5000 mg | ORAL_CAPSULE | ORAL | Status: AC

## 2010-05-30 NOTE — Assessment & Plan Note (Signed)
Will add Flonase daily to Xyzal for spring allergies.

## 2010-05-30 NOTE — Patient Instructions (Signed)
For allergies, stay on Xyzal daily.  Add Flonase 2 sprays per nostril every morning.  Add Crestor 10 mg at bedtime for high cholesterol. Call me if any problems.  Restart Phentermine every AM along with healthy diet and exercise goal of 45-60 min 5 days/ wk.  Return for nurse visit in 4 wks and an office visit in 2 mos.

## 2010-05-30 NOTE — Assessment & Plan Note (Signed)
BMI 33 c/w class I obesity.  Complicated by HTN, hyperlipidemia and fatty liver.  She gained 15 lbs since last visit.  She agrees to restart diet/ exercise and will restart Phentermine in the AM.  Recheck nurse visit in 4 wks.

## 2010-05-30 NOTE — Progress Notes (Signed)
  Subjective:    Patient ID: Colleen [name], female    DOB: 02/09/1970, 40 y.o.   MRN: [id number]  HPI  40 yo WF presents for multiple issues.  1. Weight gain.  Colleen [name] successfully lost wt last year with diet, exercise and the help of phentermine.  She did rather well on it, but had stopped losing weight, so I stopped it.  During the winter, she was going to the gym 4 days/ wk and watching her diet off phentermine but gained back 15 lbs.  Though she is trying to make healthy choices, she admits to eating more and now is pressed for time with her daughter's softball schedule.  She plans to start walking more outside and wants help with her big appetite by restarting phentermine short term.  Her weight has now lead to HTN and dyslipidemia.  2.  Her alleriges have been bad this spring.  Xyzal helps but she feels like it wears off during the day.  She had good results with nasal steroid in the past.    3.  Her cholesterol came back high if Feb.  She is on NuvaRing for birth control.  She has yet to start cholesterol medicine.  ,BP 123/74  Pulse 78  Ht 5' 4.75" (1.645 m)  Wt 201 lb (91.173 kg)  BMI 33.71 kg/m2  SpO2 100%    Review of Systems  Constitutional: Positive for unexpected weight change. Negative for fatigue.  HENT: Positive for congestion, rhinorrhea, sneezing and postnasal drip. Negative for sore throat.   Eyes: Positive for itching.  Respiratory: Negative for cough and shortness of breath.   Cardiovascular: Negative for chest pain, palpitations and leg swelling.  Skin: Negative for rash.  Neurological: Negative for headaches.       Objective:   Physical Exam  Constitutional: She appears well-developed and well-nourished. No distress.  HENT:  Head: Normocephalic and atraumatic.  Right Ear: External ear normal.  Left Ear: External ear normal.  Mouth/Throat: Oropharynx is clear and moist.       Nasal congestion with boggy turbinates  Eyes: Conjunctivae are normal.  Right eye exhibits no discharge.  Neck: No thyromegaly present.  Cardiovascular: Normal rate, regular rhythm and normal heart sounds.   No murmur heard. Pulmonary/Chest: Effort normal and breath sounds normal.  Musculoskeletal: She exhibits no edema.  Lymphadenopathy:    She has no cervical adenopathy.  Skin: Skin is warm and dry.  Psychiatric: She has a normal mood and affect.          Assessment & Plan:

## 2010-05-30 NOTE — Assessment & Plan Note (Signed)
Reviews Feb labs with her today with LDL of 156.  She is agreeable to treatment.  She is on birth control.  Will start crestor 10 mg qhs.  Call if any problems.  Recheck LDL with LFTs in 8 wks.

## 2010-06-21 ENCOUNTER — Ambulatory Visit (INDEPENDENT_AMBULATORY_CARE_PROVIDER_SITE_OTHER): Payer: 59 | Admitting: Family Medicine

## 2010-06-21 ENCOUNTER — Encounter: Payer: Self-pay | Admitting: Family Medicine

## 2010-06-21 ENCOUNTER — Telehealth: Payer: Self-pay | Admitting: Family Medicine

## 2010-06-21 DIAGNOSIS — E669 Obesity, unspecified: Secondary | ICD-10-CM

## 2010-06-21 DIAGNOSIS — I10 Essential (primary) hypertension: Secondary | ICD-10-CM

## 2010-06-21 NOTE — Telephone Encounter (Signed)
Pls let pt know that she lost just 2 lbs in the past month with phentermine.  Will not RF but I do suggest weight watcher and / or meeting with a personal trainer to get her goals achieved.

## 2010-06-21 NOTE — Progress Notes (Signed)
  Subjective:    Patient ID: Colleen [name], female    DOB: March 16, 1970, 40 y.o.   MRN: [id number]  HPI   Pt denies chest pain, SOB, dizziness, or heart palpitations.  Taking meds as directed w/o problems.  Denies medication side effects.  5 min spent with pt.   Patient doing well on appetite suppressant.  Here for nurse visit, weight, BP, HR check.  Denies problems with insomnia, heart palpitations or tremors.  Satisfied with weight loss thus far and is working on [employer] and regular exercise.       Review of Systems     Objective:   Physical Exam        Assessment & Plan:

## 2010-06-21 NOTE — Assessment & Plan Note (Signed)
Wt down only 2 lbs with adding Phentermine back on board with class I obestiy, complicated by fatty liver dz, HTN and dyslipidemia.  Will hold off on RFs given results. I would recommend weight watchers, +/- fitness training with a personal trainer.

## 2010-06-21 NOTE — Assessment & Plan Note (Signed)
BP looks great. Continue current meds.

## 2010-07-11 NOTE — Telephone Encounter (Signed)
LMOM informing Pt of the above 

## 2010-09-11 ENCOUNTER — Encounter: Payer: Self-pay | Admitting: Family Medicine

## 2010-09-12 ENCOUNTER — Other Ambulatory Visit: Payer: Self-pay | Admitting: Family Medicine

## 2010-09-12 ENCOUNTER — Encounter: Payer: Self-pay | Admitting: Family Medicine

## 2010-09-12 ENCOUNTER — Ambulatory Visit (INDEPENDENT_AMBULATORY_CARE_PROVIDER_SITE_OTHER): Payer: 59 | Admitting: Family Medicine

## 2010-09-12 ENCOUNTER — Telehealth: Payer: Self-pay | Admitting: Family Medicine

## 2010-09-12 VITALS — BP 141/92 | HR 100 | Wt 213.0 lb

## 2010-09-12 DIAGNOSIS — E669 Obesity, unspecified: Secondary | ICD-10-CM

## 2010-09-12 DIAGNOSIS — I10 Essential (primary) hypertension: Secondary | ICD-10-CM

## 2010-09-12 DIAGNOSIS — E785 Hyperlipidemia, unspecified: Secondary | ICD-10-CM

## 2010-09-12 MED ORDER — OLMESARTAN MEDOXOMIL 40 MG PO TABS: 20.0000 mg | ORAL_TABLET | Freq: Every day | ORAL | Status: DC

## 2010-09-12 MED ORDER — OLMESARTAN MEDOXOMIL 40 MG PO TABS: 40.0000 mg | ORAL_TABLET | Freq: Every day | ORAL | Status: DC

## 2010-09-12 MED ORDER — PHENTERMINE HCL 37.5 MG PO CAPS: 37.5000 mg | ORAL_CAPSULE | ORAL | Status: DC

## 2010-09-12 MED ORDER — ROSUVASTATIN CALCIUM 10 MG PO TABS: 10.0000 mg | ORAL_TABLET | Freq: Every day | ORAL | Status: DC

## 2010-09-12 NOTE — Assessment & Plan Note (Signed)
BP high today off meds.  Will restart Benicar at 40 mg 1/2 tab daily.   reheck in 2 mos.

## 2010-09-12 NOTE — Progress Notes (Signed)
  Subjective:    Patient ID: Edwin Cap, female    DOB: [birth year], 40 y.o.   MRN: [id number]  HPI  40 yo WF presents for f/u HTN, high cholesterol and weight.  She has been doing well on her meds but ran out of Benicar 2 days ago.  Still making healthy choices with her food but is snacking more at night off the phentermine.  She is still working out 4-5 days a wk for an hour of combined cardio and weights.  No chest pain or DOE with exercise.  She has gained 15 lbs in the past 2 mos off phentermine.  BP 141/92  Pulse 100  Wt 213 lb (96.616 kg)  LMP 08/12/2010   Review of Systems  Constitutional: Positive for unexpected weight change (gain).  HENT: Positive for postnasal drip.   Respiratory: Negative for shortness of breath.   Cardiovascular: Negative for chest pain and palpitations.       Objective:   Physical Exam  Constitutional: She appears well-developed and well-nourished. No distress.       obese  HENT:  Mouth/Throat: Oropharynx is clear and moist.  Neck: Neck supple.  Cardiovascular: Normal rate, regular rhythm and normal heart sounds.   No murmur heard. Pulmonary/Chest: Effort normal and breath sounds normal.  Musculoskeletal: She exhibits no edema.  Skin: Skin is warm and dry.  Psychiatric: She has a normal mood and affect.          Assessment & Plan:

## 2010-09-12 NOTE — Telephone Encounter (Signed)
Pharm called and needed to clarify directions on pt Benicar script. Plan:  Clarified correct directions as of las office visit 09-12-10. Jarvis Newcomer, LPN [facility]

## 2010-09-12 NOTE — Telephone Encounter (Signed)
Pharmacist called to verify the dosing on the pt Colleen [name] medication. Plan:  Verified. Jarvis Newcomer, LPN [facility]

## 2010-09-12 NOTE — Assessment & Plan Note (Signed)
Due to check LDL and LFTs after starting statin 3 mos ago.  Doing well on it.  RFd and check labs today.

## 2010-09-12 NOTE — Patient Instructions (Signed)
Update cholesterol lab today.  Will call you w/ result tomorrow.  Restart Benicar daily.  OK to restart Phentermine each AM along with healthy diet (~1400 kcal/ day) + 1 hr of exercise 5 days/ wk.  Call me if any problems.  Return for f/u in 1 month.

## 2010-09-12 NOTE — Assessment & Plan Note (Signed)
Wt gain 15 lbs in the past 2-3 mos off phentermine with her continuing to work on diet/ exercise.  She is really motivated to hit her goals and was not having problems on phentermine.  I will closely watch her BP, assuming the rise is from her not being on her Benicar.

## 2010-09-13 ENCOUNTER — Telehealth: Payer: Self-pay | Admitting: Family Medicine

## 2010-09-13 LAB — HEPATIC FUNCTION PANEL
ALT: 17 U/L (ref 0–35)
AST: 24 U/L (ref 0–37)
Bilirubin, Direct: 0.1 mg/dL (ref 0.0–0.3)
Total Protein: 7.1 g/dL (ref 6.0–8.3)

## 2010-09-13 LAB — LDL CHOLESTEROL, DIRECT: Direct LDL: 113 mg/dL — ABNORMAL HIGH

## 2010-09-13 NOTE — Telephone Encounter (Signed)
Pls let pt know that her LDL dropped from 156 to 113, great!~  Stay on current cholesterol meds.

## 2010-09-13 NOTE — Telephone Encounter (Signed)
Pt notified of results. KJ LPN 

## 2010-11-01 ENCOUNTER — Telehealth: Payer: Self-pay | Admitting: Family Medicine

## 2010-11-01 MED ORDER — BECLOMETHASONE DIPROPIONATE 80 MCG/ACT NA AERS: 2.0000 | INHALATION_SPRAY | NASAL | Status: DC

## 2010-11-04 NOTE — Telephone Encounter (Signed)
Closed

## 2010-11-08 ENCOUNTER — Telehealth: Payer: Self-pay | Admitting: Family Medicine

## 2010-11-08 MED ORDER — BECLOMETHASONE DIPROPIONATE 80 MCG/ACT NA AERS: 2.0000 | INHALATION_SPRAY | NASAL | Status: DC

## 2010-11-08 NOTE — Telephone Encounter (Signed)
Pt said wrong nasal spray sent to pharmacy.  One sent is one on med list beclomethasone which is generic Qnasl..  Pt states Dr. Cathey Endow gave her Qnasl in August 2012, and pt  Can pup sample. Plan:  Sample 8.79 given for pup. Jarvis Newcomer, LPN [facility]

## 2011-03-20 ENCOUNTER — Encounter: Payer: Self-pay | Admitting: Family Medicine

## 2011-03-20 ENCOUNTER — Ambulatory Visit
Admission: RE | Admit: 2011-03-20 | Discharge: 2011-03-20 | Disposition: A | Discharge status: Home / Self Care | Payer: 59 | Source: Ambulatory Visit | Attending: Family Medicine | Admitting: Family Medicine

## 2011-03-20 ENCOUNTER — Ambulatory Visit (INDEPENDENT_AMBULATORY_CARE_PROVIDER_SITE_OTHER): Payer: 59 | Admitting: Family Medicine

## 2011-03-20 VITALS — BP 122/78 | HR 91 | Temp 98.2°F | Ht 65.0 in | Wt 239.0 lb

## 2011-03-20 DIAGNOSIS — R05 Cough: Secondary | ICD-10-CM

## 2011-03-20 DIAGNOSIS — J209 Acute bronchitis, unspecified: Secondary | ICD-10-CM

## 2011-03-20 DIAGNOSIS — R053 Chronic cough: Secondary | ICD-10-CM

## 2011-03-20 DIAGNOSIS — K2100 Gastro-esophageal reflux disease with esophagitis, without bleeding: Secondary | ICD-10-CM

## 2011-03-20 MED ORDER — AZITHROMYCIN 250 MG PO TABS: ORAL_TABLET | ORAL | Status: AC

## 2011-03-20 MED ORDER — HYDROCOD POLST-CHLORPHEN POLST 10-8 MG/5ML PO LQCR: 5.0000 mL | Freq: Two times a day (BID) | ORAL | Status: DC | PRN

## 2011-03-20 MED ORDER — PANTOPRAZOLE SODIUM 40 MG PO TBEC: 40.0000 mg | DELAYED_RELEASE_TABLET | Freq: Every day | ORAL | Status: DC

## 2011-03-20 NOTE — Patient Instructions (Signed)
Cough, Adult  A cough is a reflex that helps clear your throat and airways. It can help heal the body or may be a reaction to an irritated airway. A cough may only last 2 or 3 weeks (acute) or may last more than 8 weeks (chronic).  CAUSES Acute cough:  Viral or bacterial infections.  Chronic cough:  Infections.   Allergies.   Asthma.   Post-nasal drip.   Smoking.   Heartburn or acid reflux.   Some medicines.   Chronic lung problems (COPD).   Cancer.  SYMPTOMS   Cough.   Fever.   Chest pain.   Increased breathing rate.   High-pitched whistling sound when breathing (wheezing).   Colored mucus that you cough up (sputum).  TREATMENT   A bacterial cough may be treated with antibiotic medicine.   A viral cough must run its course and will not respond to antibiotics.   Your caregiver may recommend other treatments if you have a chronic cough.  HOME CARE INSTRUCTIONS   Only take over-the-counter or prescription medicines for pain, discomfort, or fever as directed by your caregiver. Use cough suppressants only as directed by your caregiver.   Use a cold steam vaporizer or humidifier in your bedroom or home to help loosen secretions.   Sleep in a semi-upright position if your cough is worse at night.   Rest as needed.   Stop smoking if you smoke.  SEEK IMMEDIATE MEDICAL CARE IF:   You have pus in your sputum.   Your cough starts to worsen.   You cannot control your cough with suppressants and are losing sleep.   You begin coughing up blood.   You have difficulty breathing.   You develop pain which is getting worse or is uncontrolled with medicine.   You have a fever.  MAKE SURE YOU:   Understand these instructions.   Will watch your condition.   Will get help right away if you are not doing well or get worse.  Document Released: 07/19/2010 Document Revised: 10/02/2010 Document Reviewed: 07/19/2010 [facility] Patient Information 2012 [place],  [place].Gastroesophageal Reflux Disease, Adult Gastroesophageal reflux disease (GERD) happens when acid from your stomach flows up into the esophagus. When acid comes in contact with the esophagus, the acid causes soreness (inflammation) in the esophagus. Over time, GERD may create small holes (ulcers) in the lining of the esophagus. CAUSES   Increased body weight. This puts pressure on the stomach, making acid rise from the stomach into the esophagus.   Smoking. This increases acid production in the stomach.   Drinking alcohol. This causes decreased pressure in the lower esophageal sphincter (valve or ring of muscle between the esophagus and stomach), allowing acid from the stomach into the esophagus.   Late evening meals and a full stomach. This increases pressure and acid production in the stomach.   A malformed lower esophageal sphincter.  Sometimes, no cause is found. SYMPTOMS   Burning pain in the lower part of the mid-chest behind the breastbone and in the mid-stomach area. This may occur twice a week or more often.   Trouble swallowing.   Sore throat.   Dry cough.   Asthma-like symptoms including chest tightness, shortness of breath, or wheezing.  DIAGNOSIS  Your caregiver may be able to diagnose GERD based on your symptoms. In some cases, X-rays and other tests may be done to check for complications or to check the condition of your stomach and esophagus. TREATMENT  Your caregiver may recommend over-the-counter  or prescription medicines to help decrease acid production. Ask your caregiver before starting or adding any new medicines.  HOME CARE INSTRUCTIONS   Change the factors that you can control. Ask your caregiver for guidance concerning weight loss, quitting smoking, and alcohol consumption.   Avoid foods and drinks that make your symptoms worse, such as:   Caffeine or alcoholic drinks.   Chocolate.   Peppermint or mint flavorings.   Garlic and onions.   Spicy  foods.   Citrus fruits, such as oranges, lemons, or limes.   Tomato-based foods such as sauce, chili, salsa, and pizza.   Fried and fatty foods.   Avoid lying down for the 3 hours prior to your bedtime or prior to taking a nap.   Eat small, frequent meals instead of large meals.   Wear loose-fitting clothing. Do not wear anything tight around your waist that causes pressure on your stomach.   Raise the head of your bed 6 to 8 inches with wood blocks to help you sleep. Extra pillows will not help.   Only take over-the-counter or prescription medicines for pain, discomfort, or fever as directed by your caregiver.   Do not take aspirin, ibuprofen, or other nonsteroidal anti-inflammatory drugs (NSAIDs).  SEEK IMMEDIATE MEDICAL CARE IF:   You have pain in your arms, neck, jaw, teeth, or back.   Your pain increases or changes in intensity or duration.   You develop nausea, vomiting, or sweating (diaphoresis).   You develop shortness of breath, or you faint.   Your vomit is green, yellow, black, or looks like coffee grounds or blood.   Your stool is red, bloody, or black.  These symptoms could be signs of other problems, such as heart disease, gastric bleeding, or esophageal bleeding. MAKE SURE YOU:   Understand these instructions.   Will watch your condition.   Will get help right away if you are not doing well or get worse.  Document Released: 10/30/2004 Document Revised: 10/02/2010 Document Reviewed: 08/09/2010 [facility] Patient Information 2012 [place], [place].

## 2011-03-20 NOTE — Progress Notes (Signed)
  Subjective:    Patient ID: Colleen [name], female    DOB: [birth year], 41 y.o.   MRN: [id number]  Cough This is a recurrent problem. The current episode started more than 1 month ago (started in October w/ a URI and a 2nd URI but never shook thhe cough). The problem occurs every few hours. The cough is non-productive. Associated symptoms include heartburn, shortness of breath and wheezing. Pertinent negatives include no chest pain, chills, ear congestion, ear pain, fever, headaches, hemoptysis, myalgias, nasal congestion, postnasal drip, rash, rhinorrhea, sore throat, sweats or weight loss. The symptoms are aggravated by lying down. She has tried OTC cough suppressant for the symptoms. The treatment provided no relief. Her past medical history is significant for bronchitis. There is no history of asthma, bronchiectasis, COPD, emphysema, environmental allergies or pneumonia.    Patient has had a cough since October  Review of Systems  Constitutional: Negative for fever, chills and weight loss.  HENT: Negative for ear pain, sore throat, rhinorrhea and postnasal drip.   Respiratory: Positive for cough, shortness of breath and wheezing. Negative for hemoptysis.   Cardiovascular: Negative for chest pain.  Gastrointestinal: Positive for heartburn.  Musculoskeletal: Negative for myalgias.  Skin: Negative for rash.  Neurological: Negative for headaches.  Hematological: Negative for environmental allergies.  All other systems reviewed and are negative.      BP 122/78  Pulse 91  Temp(Src) 98.2 F (36.8 C) (Oral)  Ht 5\' 5"  (1.651 m)  Wt 239 lb (108.41 kg)  BMI 39.77 kg/m2  SpO2 97% Objective:   Physical Exam  Nursing note and vitals reviewed. Constitutional: She is oriented to person, place, and time. She appears well-developed and well-nourished. No distress.  HENT:  Head: Normocephalic and atraumatic.  Right Ear: Tympanic membrane normal.  Left Ear: Tympanic membrane normal.    Nose: Nose normal.  Mouth/Throat: Oropharynx is clear and moist. No oropharyngeal exudate.  Eyes: Right eye exhibits no discharge. Left eye exhibits no discharge. No scleral icterus.  Neck: Normal range of motion. Neck supple.  Cardiovascular: Normal rate, regular rhythm and normal heart sounds.   Pulmonary/Chest: Breath sounds normal. No accessory muscle usage. No respiratory distress. She has no wheezes. She has no rales.  Lymphadenopathy:    She has no cervical adenopathy.  Neurological: She is alert and oriented to person, place, and time.  Skin: Skin is warm and dry.          Assessment & Plan:  Cough persistent, bronchitis, reflux esophagitis Explained to patient that I think is anomaly takeoff that is recurring do to reflux and which lays down the front cough is worse and not nearly as bad during the day he was also having some reflux then as well. Explained that this is reflux her socks as part been injured in damage and this would probably need to take a PPI for least 2-3 months on a regular basis stressed her the importance of elevating the head of her bed via cinderblock brick or wedge between the mattress and the box spring. Return for followup in 3 months if not improving. Patient placed on a Z-Pak PPI Protonix 40 mg one tablet a day And Tussionex 1 teaspoon twice a day as needed for the cough Because of the duration of the cough chest x-ray was also obtained as well.

## 2011-04-04 ENCOUNTER — Telehealth: Payer: Self-pay | Admitting: *Deleted

## 2011-04-04 MED ORDER — BECLOMETHASONE DIPROPIONATE 80 MCG/ACT NA AERS: 2.0000 | INHALATION_SPRAY | Freq: Every day | NASAL | Status: DC

## 2011-04-04 NOTE — Telephone Encounter (Signed)
Refilled rx

## 2011-04-04 NOTE — Telephone Encounter (Signed)
Pt sttaes Dr. Cathey Endow before she left gave her a nasal spray called Qnasal and would like this called to her pharmcay. This helps with her allergies. Pharmacy is [facility].

## 2011-05-09 ENCOUNTER — Other Ambulatory Visit: Payer: Self-pay | Admitting: *Deleted

## 2011-05-09 MED ORDER — LEVOCETIRIZINE DIHYDROCHLORIDE 5 MG PO TABS: 5.0000 mg | ORAL_TABLET | Freq: Every evening | ORAL | Status: DC

## 2011-06-02 ENCOUNTER — Encounter: Payer: Self-pay | Admitting: Physician Assistant

## 2011-06-02 ENCOUNTER — Ambulatory Visit (INDEPENDENT_AMBULATORY_CARE_PROVIDER_SITE_OTHER): Payer: 59 | Admitting: Physician Assistant

## 2011-06-02 ENCOUNTER — Telehealth: Payer: Self-pay | Admitting: *Deleted

## 2011-06-02 VITALS — BP 123/81 | HR 103 | Temp 98.8°F | Ht 65.0 in | Wt 243.0 lb

## 2011-06-02 DIAGNOSIS — Z23 Encounter for immunization: Secondary | ICD-10-CM

## 2011-06-02 DIAGNOSIS — J309 Allergic rhinitis, unspecified: Secondary | ICD-10-CM

## 2011-06-02 DIAGNOSIS — R05 Cough: Secondary | ICD-10-CM

## 2011-06-02 MED ORDER — ALBUTEROL SULFATE HFA 108 (90 BASE) MCG/ACT IN AERS: 2.0000 | INHALATION_SPRAY | Freq: Four times a day (QID) | RESPIRATORY_TRACT | Status: DC | PRN

## 2011-06-02 NOTE — Patient Instructions (Addendum)
Start Zetonna 1 spray each nostril once a day.  Will give albuterol inhaler to use 2 puffs  when a coughing episode starts or when feels short of breath up to every 6 hours. Followup as needed if symptoms do not resolve.  Gave Tdap today.

## 2011-06-02 NOTE — Telephone Encounter (Signed)
LMOM for pt to make f/u appt

## 2011-06-02 NOTE — Telephone Encounter (Signed)
If still on the reflux then can follow up with him.

## 2011-06-02 NOTE — Progress Notes (Signed)
  Subjective:    Patient ID: Colleen [name], female    DOB: 01-04-1971, 41 y.o.   MRN: [id number]  HPI Patient presents to the clinic for she's had a persistent cough since October. She has been treated twice with antibiotics for bronchitis. At her last office visit with Dr. Thurmond Butts she was treated with her protonix for GERD daily along with cough syrup to take at bedtime. She did have some relief for the first month but has since had the cough resumed. She reports the cough is worse when she is talking for long periods of time and she has notice that lately the cough also causes her to be short of breath. The cough does not worsen like it had been at night when she lays down. She's never been diagnosed with asthma; however, both of her sisters have had asthma. She uses xyzal and Flonase daily to help with her allergic rhinitis and seasonal allergies. She had a chest x-ray with Dr. Thurmond Butts and it was completely normal. She denies any fever, sinus pressure or headaches.   Review of Systems     Objective:   Physical Exam  Constitutional: She is oriented to person, place, and time. She appears well-nourished.  HENT:  Head: Normocephalic and atraumatic.  Right Ear: External ear normal.  Left Ear: External ear normal.       TMs are normal.  Eyes: Conjunctivae are normal.  Neck: Normal range of motion. Neck supple.  Cardiovascular: Regular rhythm, normal heart sounds and intact distal pulses.        Tachycardia at 103.  Pulmonary/Chest: Effort normal and breath sounds normal. She has no wheezes.  Lymphadenopathy:    She has no cervical adenopathy.  Neurological: She is alert and oriented to person, place, and time.  Skin: Skin is warm and dry.  Psychiatric: She has a normal mood and affect. Her behavior is normal.          Assessment & Plan:  Persistent cough/allergies/allergic rhinitis-instructed her to stop the Flonase and to start Zetonna 1 spray each not sure once a day. Continue on  xyzal. Also given albuterol inhaler to use 2 puffs when a coughing episode starts or when she feels that she is short of breath up to every 6 hours. I instructed patient that if this helped relieve the symptoms but most likely she is still a case of asthma. She's using the inhaler more than 2-3 times a week then we need to get her on a daily inhaler for the inflammation in her lungs. Followup as needed if symptoms do not resolve.  Gave Tdap today.

## 2011-06-02 NOTE — Telephone Encounter (Signed)
Pt seen Dr. Thurmond Butts on 03-20-11 for a cough. States dr. Thurmond Butts gave her some medication for acid reflux. Pt states that the cough went away but has returned about a week ago. She would like to know if there is something different she can take or does she need to make another appt. Please advise.

## 2011-06-16 ENCOUNTER — Other Ambulatory Visit: Payer: Self-pay | Admitting: *Deleted

## 2011-06-16 MED ORDER — PANTOPRAZOLE SODIUM 40 MG PO TBEC: 40.0000 mg | DELAYED_RELEASE_TABLET | Freq: Every day | ORAL | Status: DC

## 2011-07-01 ENCOUNTER — Encounter: Payer: Self-pay | Admitting: Family Medicine

## 2011-07-01 ENCOUNTER — Ambulatory Visit (INDEPENDENT_AMBULATORY_CARE_PROVIDER_SITE_OTHER): Payer: 59 | Admitting: Family Medicine

## 2011-07-01 VITALS — BP 126/77 | HR 90 | Temp 98.6°F | Ht 65.5 in | Wt 246.0 lb

## 2011-07-01 DIAGNOSIS — Z0189 Encounter for other specified special examinations: Secondary | ICD-10-CM

## 2011-07-01 DIAGNOSIS — R05 Cough: Secondary | ICD-10-CM

## 2011-07-01 DIAGNOSIS — Z008 Encounter for other general examination: Secondary | ICD-10-CM

## 2011-07-01 LAB — PULMONARY FUNCTION TEST

## 2011-07-01 MED ORDER — ALBUTEROL SULFATE (2.5 MG/3ML) 0.083% IN NEBU
2.5000 mg | INHALATION_SOLUTION | Freq: Four times a day (QID) | RESPIRATORY_TRACT | Status: DC | PRN
  Administered 2011-07-01: 2.5 mg via RESPIRATORY_TRACT

## 2011-07-01 NOTE — Progress Notes (Addendum)
Subjective:    Patient ID: Colleen [name], female    DOB: 01-Nov-1970, 41 y.o.   MRN: [id number]  HPI Dry cough since October.  Normal CXR in FEb. On protonix since Feb and not sure if helping.  Says was on prednisone for one week and that help. Then saw PA in April who thought it could be Asthma. Put her on inhaled steroid and rescue inhaler but feels it didn't help.  Says talking triggers her cough. Taking xyzal nad nasonex for allergies. She denies any chest pain or palpitations. She says sometimes the cough is triggered by a sensation of shortness of breath happens while she is talking. Does not necessarily bother her at night. She does not have a lot of excess secretions. No fever. 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   Review of Systems BP 126/77  Pulse 90  Temp(Src) 98.6 F (37 C) (Oral)  Ht 5' 5.5" (1.664 m)  Wt 246 lb (111.585 kg)  BMI 40.31 kg/m2  SpO2 98%    Allergies  Allergen Reactions  . Ace Inhibitors     REACTION: cough    Past Medical History  Diagnosis Date  . Obesity   . Hypertension   . Fatty liver disease, nonalcoholic   . Obesity     Past Surgical History  Procedure Date  . Pilonidal cyst excision     History   Social History  . Marital Status: Married    Spouse Name: N/A    Number of Children: N/A  . Years of Education: N/A   Occupational History  . Not on file.   Social History Main Topics  . Smoking status: Never Smoker   . Smokeless tobacco: Not on file  . Alcohol Use: Yes     occasional  . Drug Use:   . Sexually Active: Yes     nuva ring for birth control   Other Topics Concern  . Not on file   Social History Narrative  . No narrative on file    Family History  Problem Relation Age of Onset  . Hypertension Mother   . Diabetes Mother   . Thyroid disease Mother   . Cancer Father     Outpatient Encounter Prescriptions as  of 07/01/2011  Medication Sig Dispense Refill  . levocetirizine (XYZAL) 5 MG tablet Take 1 tablet (5 mg total) by mouth every evening.  30 tablet  2  . levonorgestrel (MIRENA) 20 MCG/24HR IUD 1 each by Intrauterine route once.      Marland Kitchen olmesartan (BENICAR) 40 MG tablet Take 0.5 tablets (20 mg total) by mouth daily.  15 tablet  6  . pantoprazole (PROTONIX) 40 MG tablet Take 1 tablet (40 mg total) by mouth daily.  30 tablet  3  . rosuvastatin (CRESTOR) 10 MG tablet Take 1 tablet (10 mg total) by mouth at bedtime.  30 tablet  6  . albuterol (PROVENTIL HFA;VENTOLIN HFA) 108 (90 BASE) MCG/ACT inhaler Inhale 2 puffs into the lungs every 6 (six) hours as needed for shortness of breath.  1 Inhaler  1  . Beclomethasone Dipropionate 80 MCG/ACT AERS Place 2 sprays into the nose daily.  8.7 g  2  . fluticasone (FLONASE) 50 MCG/ACT nasal spray 1 spray by Nasal route daily.  16 g  2   Facility-Administered Encounter Medications as of 07/01/2011  Medication Dose Route Frequency Provider Last Rate Last Dose  .  albuterol (PROVENTIL) (2.5 MG/3ML) 0.083% nebulizer solution 2.5 mg  2.5 mg Nebulization Q6H PRN Agapito Games, MD   2.5 mg at 07/01/11 1033          Objective:   Physical Exam  Constitutional: She is oriented to person, place, and time. She appears well-developed and well-nourished.  HENT:  Head: Normocephalic and atraumatic.  Right Ear: External ear normal.  Left Ear: External ear normal.  Nose: Nose normal.  Mouth/Throat: Oropharynx is clear and moist.       TMs and canals are clear.   Eyes: Conjunctivae and EOM are normal. Pupils are equal, round, and reactive to light.  Neck: Neck supple. No thyromegaly present.  Cardiovascular: Normal rate, regular rhythm and normal heart sounds.   Pulmonary/Chest: Effort normal and breath sounds normal. She has no wheezes.  Lymphadenopathy:    She has no cervical adenopathy.  Neurological: She is alert and oriented to person, place, and time.    Skin: Skin is warm and dry.  Psychiatric: She has a normal mood and affect. Her behavior is normal.          Assessment & Plan:  Cough x 6 months.  She and normal chest x-ray in February. The prednisone seemed to help with the inhaled daily steroid for 2-3 weeks did not help. She has no prior history of asthma. She does have some allergies but feels like overall there well-controlled on the thigh fall and the Nasonex. We will change her Benicar to bystolic for one month to see if cough could be ARB related thought this is uncommon. Also will perform spirometry today to evaluate for asthma.  Consider ENT referral if she's not getting better so they can evaluate her vocal cords. She's been on a PPI for 3 months. I think he would have help at this point if it was reflux related. Go ahead and wean off of the protonix over the next couple weeks.    Her spirometry results were essentially normal. Her effort was poor. Her partial very flat. no evidence of asthma or COPD. today. FVC was 90%, FEV1 was 89%, ratio of 83%. No significant change or improvement in FVC or FEV1 with albuterol.

## 2011-07-01 NOTE — Patient Instructions (Signed)
Call me in 3-4 week see still coughing and we can refer you the ENT for further workup.

## 2011-07-09 ENCOUNTER — Encounter: Payer: Self-pay | Admitting: Family Medicine

## 2011-08-13 ENCOUNTER — Telehealth: Payer: Self-pay | Admitting: *Deleted

## 2011-08-13 MED ORDER — CICLESONIDE 37 MCG/ACT NA AERS: 1.0000 | INHALATION_SPRAY | Freq: Every day | NASAL | Status: DC

## 2011-08-13 MED ORDER — NEBIVOLOL HCL 10 MG PO TABS: 10.0000 mg | ORAL_TABLET | Freq: Every day | ORAL | Status: DC

## 2011-08-13 NOTE — Telephone Encounter (Signed)
Sent bystolic and zetonna to pharmacy. Need follow up to make sure bp is stable in 1 month.

## 2011-08-13 NOTE — Telephone Encounter (Signed)
When pt seen you in May Dr. Linford Arnold started her on Bystolic. Pt states it has helped with the cough. That she only has a little cough. Pt needs a refill on Bystolic and there is not a dosage written in the chart. Please advise on what dosage to prescribe. Pt is also requesting a refill that you gave her in April for allergies and she thinks it is the [place]. Please advise if that can be filled.

## 2011-08-13 NOTE — Telephone Encounter (Signed)
Pt informed

## 2011-09-11 ENCOUNTER — Encounter: Payer: Self-pay | Admitting: Family Medicine

## 2011-09-11 ENCOUNTER — Ambulatory Visit (INDEPENDENT_AMBULATORY_CARE_PROVIDER_SITE_OTHER): Payer: 59 | Admitting: Family Medicine

## 2011-09-11 VITALS — BP 132/83 | HR 73 | Ht 65.5 in | Wt 247.0 lb

## 2011-09-11 DIAGNOSIS — M79646 Pain in unspecified finger(s): Secondary | ICD-10-CM

## 2011-09-11 DIAGNOSIS — S0340XA Sprain of jaw, unspecified side, initial encounter: Secondary | ICD-10-CM

## 2011-09-11 DIAGNOSIS — M79641 Pain in right hand: Secondary | ICD-10-CM

## 2011-09-11 DIAGNOSIS — M79609 Pain in unspecified limb: Secondary | ICD-10-CM

## 2011-09-11 MED ORDER — BECLOMETHASONE DIPROPIONATE 80 MCG/ACT NA AERS: 2.0000 | INHALATION_SPRAY | Freq: Every day | NASAL | Status: DC

## 2011-09-11 MED ORDER — MELOXICAM 15 MG PO TABS: 15.0000 mg | ORAL_TABLET | Freq: Every day | ORAL | Status: DC

## 2011-09-11 NOTE — Progress Notes (Signed)
  Subjective:    Patient ID: Colleen [name], female    DOB: October 10, 1970, 41 y.o.   MRN: [id number]  HPI Pain over the first MCP on right hand x 3 weeks.  She says it is painful to open jars and can.  Joint cracks.  No swelling.  No family hx of RA.  No old injuries. No stiffness. No locking.  Not taking any meds for pain.  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 No real alleviating factors.  Went to the dentist about 2 months ago noticed jaw was sore.  Used to grind her teeth as a child. Dentist says not grinding but clenching.  Say right jaw is sore and painful to eat sometimes.  Not taking any  meds for pain.  Says tries to stretch it.  Her dentist felt like she did not have any tooth or gum disease. Encouraged her to monitor for any clenching and to try rest the jaw. No real alleviating factors.   Review of Systems     Objective:   Physical Exam  Constitutional: She is oriented to person, place, and time. She appears well-developed and well-nourished.  HENT:  Head: Normocephalic and atraumatic.       Right canal and TM. Tender over the right TMJ, no clicking or popping with open and close.    Neck: Neck supple. No thyromegaly present.  Musculoskeletal:       Right hand with NROM of the wrist and all finger. No swelling of the MCP, DIP, PIPs.  Nontender joints.  NROM. Strength is 5/5 in all fingers.   Lymphadenopathy:    She has no cervical adenopathy.  Neurological: She is alert and oriented to person, place, and time.  Skin: Skin is warm and dry.  Psychiatric: She has a normal mood and affect. Her behavior is normal.          Assessment & Plan:  Right hand pain - New problem. MCP pain right hand - will get xray for further evaluation. I suspect she may have some mild osteoarthritis or possibly even a tendinitis of the finger. Her strength is normal she has no pain over the joint or tendon itself. If the x-ray is normal the most likely tendinitis and we can try  resting the joint with a splint for one week. Also discussed importance of anti-inflammatories to help control pain and inflammation. I sent her a prescription for meloxicam.  Right TMJ- New problem. Discussed tx options.  Given handout. First, conservative treatment with anti-inflammatories.Discussed working on stretching exercises. Avoid excessive chewing like chewing gum.  She had a recent dental exam so that rules out tooth or gum disease.

## 2011-09-11 NOTE — Patient Instructions (Addendum)
Temporomandibular Joint Pain Your exam shows that you have a problem with your temporomandibular joint (TMJ), the joint that moves when you open your mouth or chew food. TMJ problems can result from direct injuries, bite abnormalities, or tension states which cause you to grind or clench your teeth. Typical symptoms include pain around the joint, clicking, restricted movement, and headaches. The TMJ is like any other joint in the body; when it is strained, it needs rest to repair itself. To keep the joint at rest it is important that you do not open your mouth wider than the width of your index finger. If you must yawn, be sure to support your chin with your hand so your mouth does not open wide. Eat a soft diet (nothing firmer than ground beef, no raw vegetables), do not chew gum and do not talk if it causes you pain. Apply topical heat by using a warm, moist cloth placed in front of the ear for 15 to 20 minutes several times daily. Alternating heat and ice may give even more relief. Anti-inflammatory pain medicine and muscle relaxants can also be helpful. A dental orthotic or splint may be used for temporary relief. Long-term problems may require treatment for stress as well as braces or surgery. Please check with your doctor or dentist if your symptoms do not improve within one week. Document Released: 02/28/2004 Document Revised: 01/09/2011 Document Reviewed: 01/20/2005 [facility] Patient Information 2012 [place], [place].   Call me if not better in 3-4 weeks.   Can take the meloxicam for your TMJ and finger with food and water. Stop immediately if any GI upset or irritation.

## 2011-12-10 ENCOUNTER — Telehealth: Payer: Self-pay | Admitting: *Deleted

## 2011-12-10 MED ORDER — CICLESONIDE 37 MCG/ACT NA AERS: 1.0000 | INHALATION_SPRAY | Freq: Every day | NASAL | Status: DC

## 2011-12-10 NOTE — Telephone Encounter (Signed)
Sent to pharmacy may be too expensive. Call before pick up at pharmacy. If too expensive call and we can try something else. We don't have any cards in office.

## 2011-12-10 NOTE — Telephone Encounter (Signed)
Pt calls and states given a sample of Zetonna nasal spray back in September and would like to either get rx or another sample.

## 2012-01-05 ENCOUNTER — Other Ambulatory Visit: Payer: Self-pay | Admitting: Physician Assistant

## 2012-01-26 ENCOUNTER — Other Ambulatory Visit: Payer: Self-pay | Admitting: *Deleted

## 2012-01-26 MED ORDER — BECLOMETHASONE DIPROPIONATE 80 MCG/ACT NA AERS: 2.0000 | INHALATION_SPRAY | Freq: Every day | NASAL | Status: DC

## 2012-05-26 ENCOUNTER — Other Ambulatory Visit: Payer: Self-pay | Admitting: Family Medicine

## 2012-06-17 ENCOUNTER — Encounter: Payer: Self-pay | Admitting: Sports Medicine

## 2012-06-17 ENCOUNTER — Ambulatory Visit (INDEPENDENT_AMBULATORY_CARE_PROVIDER_SITE_OTHER): Payer: 59 | Admitting: Sports Medicine

## 2012-06-17 VITALS — BP 125/82 | HR 77 | Wt 255.0 lb

## 2012-06-17 DIAGNOSIS — J209 Acute bronchitis, unspecified: Secondary | ICD-10-CM

## 2012-06-17 DIAGNOSIS — J309 Allergic rhinitis, unspecified: Secondary | ICD-10-CM

## 2012-06-17 MED ORDER — BENZONATATE 200 MG PO CAPS: 200.0000 mg | ORAL_CAPSULE | Freq: Three times a day (TID) | ORAL | Status: DC | PRN

## 2012-06-17 MED ORDER — QNASL 80 MCG/ACT NA AERS: 2.0000 | INHALATION_SPRAY | Freq: Every day | NASAL | Status: DC

## 2012-06-17 NOTE — Assessment & Plan Note (Signed)
Desires to switch intranasal steroid to QNasl.

## 2012-06-17 NOTE — Assessment & Plan Note (Signed)
Likely viral. Tessalon Perles. Return if no better in 2 weeks.

## 2012-06-17 NOTE — Progress Notes (Signed)
  Subjective:    CC: Cough  HPI: This very pleasant 42 year old female comes in with a one week history of dry cough. 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 Cough is nonproductive, and is giving her a little bit of pain between her shoulder blades. It is mild to moderate, does not keep her up at night. It occurs when she talks a lot, gets an itchy throat, and then has an urge to cough. Denies any fevers, chills, night sweats, weight loss, no lower extremity swelling, no recent car rides.  Past medical history, Surgical history, Family history not pertinant except as noted below, Social history, Allergies, and medications have been entered into the medical record, reviewed, and no changes needed.   Review of Systems: No fevers, chills, night sweats, weight loss, chest pain, or shortness of breath.   Objective:    General: Well Developed, well nourished, and in no acute distress.  Neuro: Alert and oriented x3, extra-ocular muscles intact, sensation grossly intact.  HEENT: Normocephalic, atraumatic, pupils equal round reactive to light, neck supple, no masses, no lymphadenopathy, thyroid nonpalpable.  Skin: Warm and dry, no rashes. Cardiac: Regular rate and rhythm, no murmurs rubs or gallops, no lower extremity edema.  Respiratory: Clear to auscultation bilaterally. Not using accessory muscles, speaking in full sentences.  Impression and Recommendations:

## 2012-06-17 NOTE — Patient Instructions (Addendum)

## 2012-06-21 ENCOUNTER — Telehealth: Payer: Self-pay | Admitting: Family Medicine

## 2012-06-21 NOTE — Telephone Encounter (Signed)
Call pt: Got letter from insurance co that she is not taking some of her meds regularly. Encourage her to be consistant.  If having any problems getting meds then please let us know.

## 2012-06-22 NOTE — Telephone Encounter (Signed)
Pt has made appt for Friday she has not been seen since 06/2011 and would like to discuss continued cough that she is having. She was seen by Dr. Benjamin Stain o 5.15 for this and is not improving.Colleen [name] [place]

## 2012-06-25 ENCOUNTER — Ambulatory Visit (HOSPITAL_BASED_OUTPATIENT_CLINIC_OR_DEPARTMENT_OTHER)
Admission: RE | Admit: 2012-06-25 | Discharge: 2012-06-25 | Disposition: A | Discharge status: Home / Self Care | Payer: 59 | Source: Ambulatory Visit | Attending: Family Medicine | Admitting: Family Medicine

## 2012-06-25 ENCOUNTER — Telehealth: Payer: Self-pay | Admitting: *Deleted

## 2012-06-25 ENCOUNTER — Encounter: Payer: Self-pay | Admitting: Family Medicine

## 2012-06-25 ENCOUNTER — Ambulatory Visit: Payer: 59

## 2012-06-25 ENCOUNTER — Ambulatory Visit (INDEPENDENT_AMBULATORY_CARE_PROVIDER_SITE_OTHER): Payer: 59 | Admitting: Family Medicine

## 2012-06-25 VITALS — BP 123/75 | HR 77 | Wt 255.0 lb

## 2012-06-25 DIAGNOSIS — R05 Cough: Secondary | ICD-10-CM

## 2012-06-25 DIAGNOSIS — I10 Essential (primary) hypertension: Secondary | ICD-10-CM

## 2012-06-25 DIAGNOSIS — E785 Hyperlipidemia, unspecified: Secondary | ICD-10-CM

## 2012-06-25 DIAGNOSIS — R059 Cough, unspecified: Secondary | ICD-10-CM | POA: Insufficient documentation

## 2012-06-25 MED ORDER — HYDROCODONE-HOMATROPINE 5-1.5 MG/5ML PO SYRP: 5.0000 mL | ORAL_SOLUTION | Freq: Three times a day (TID) | ORAL | Status: DC | PRN

## 2012-06-25 NOTE — Telephone Encounter (Signed)
This certainly could be contributing. Usually they work at the building cleaned out soon and she can see if within a couple weeks if she improves.

## 2012-06-25 NOTE — Telephone Encounter (Signed)
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

## 2012-06-25 NOTE — Progress Notes (Signed)
Subjective:    Patient ID: Colleen [name], female    DOB: 13-Jul-1970, 42 y.o.   MRN: [id number]  HPI HTN-  Pt denies chest pain, SOB, dizziness, or heart palpitations.  Taking meds as directed w/o problems.  Denies medication side effects.    Hyperlipidemia-last checked in 2012 and was elevated at that time.  Cough  - seen a week ago and diagnosed with viral bronchitis. Given Tessalon Perles and nasal surgery. She says the cough is not really any better.  Has been coughign for 3 weeks total. She says never really had additional cold sxs or fever when first started. Never really felt that bad.  She thought it was initially her allergy meds causing her sxs so stopped them. Mostly dry cough.  No fever.  ON SOB except with cough. Talking aggrevating it. No heartburn or reflux with it.  Not worse at night.  Not waking her up at night.  Hasn't lost her voice.  No sick contacts. Never had asthma.  On nasal steroid spray year round.   Review of Systems BP 123/75  Pulse 77  Wt 255 lb (115.667 kg)  BMI 41.77 kg/m2    Allergies  Allergen Reactions  . Ace Inhibitors     REACTION: cough    Past Medical History  Diagnosis Date  . Obesity   . Hypertension   . Fatty liver disease, nonalcoholic   . Obesity     Past Surgical History  Procedure Laterality Date  . Pilonidal cyst excision      History   Social History  . Marital Status: Married    Spouse Name: N/A    Number of Children: N/A  . Years of Education: N/A   Occupational History  . Not on file.   Social History Main Topics  . Smoking status: Never Smoker   . Smokeless tobacco: Not on file  . Alcohol Use: Yes     Comment: occasional  . Drug Use:   . Sexually Active: Yes     Comment: nuva ring for birth control   Other Topics Concern  . Not on file   Social History Narrative  . No narrative on file    Family History  Problem Relation Age of Onset  . Hypertension Mother   . Diabetes Mother   . Thyroid  disease Mother   . Cancer Father     Outpatient Encounter Prescriptions as of 06/25/2012  Medication Sig Dispense Refill  . BYSTOLIC 10 MG tablet TAKE 1 TABLET (10 MG TOTAL) BY MOUTH DAILY.  30 tablet  1  . levocetirizine (XYZAL) 5 MG tablet TAKE 1 TABLET BY MOUTH EVERY DAY  30 tablet  1  . levonorgestrel (MIRENA) 20 MCG/24HR IUD 1 each by Intrauterine route once.      Illa Level 80 MCG/ACT AERS Place 2 sprays into both nostrils daily.  1 Inhaler  11  . HYDROcodone-homatropine (HYCODAN) 5-1.5 MG/5ML syrup Take 5 mLs by mouth every 8 (eight) hours as needed for cough.  180 mL  0  . [DISCONTINUED] benzonatate (TESSALON) 200 MG capsule Take 1 capsule (200 mg total) by mouth 3 (three) times daily as needed for cough.  45 capsule  0   No facility-administered encounter medications on file as of 06/25/2012.          Objective:   Physical Exam  Constitutional: She is oriented to person, place, and time. She appears well-developed and well-nourished.  HENT:  Head: Normocephalic and atraumatic.  Right Ear:  External ear normal.  Left Ear: External ear normal.  Nose: Nose normal.  Mouth/Throat: Oropharynx is clear and moist.  TMs and canals are clear.   Eyes: Conjunctivae and EOM are normal. Pupils are equal, round, and reactive to light.  Neck: Neck supple. No thyromegaly present.  Cardiovascular: Normal rate, regular rhythm and normal heart sounds.   Pulmonary/Chest: Effort normal and breath sounds normal. She has no wheezes.  Lymphadenopathy:    She has no cervical adenopathy.  Neurological: She is alert and oriented to person, place, and time.  Skin: Skin is warm and dry.  Psychiatric: She has a normal mood and affect.          Assessment & Plan:  HTN - well controlled. Continue current regimen. Followup in 6 months. Due for CMP and fasting lipid panel. Given labslip.   Hyperlipidemia-due to recheck cholesterol. Lab slip given.  Cough - Dry cough -Will get CXR.  Had tried reflux  meds in past without relief.  Not responding to allergy meds. Previous cough with ACE. She wonders why she seems to get a persistent cough each year. Not always the same time of year.  Has had spirometry in past that was normal.  Will get xray. If normal then if not better in 2 weeks will refer to pulm for further evaluation.

## 2012-06-25 NOTE — Telephone Encounter (Signed)
Pt.notified

## 2012-07-09 LAB — COMPLETE METABOLIC PANEL WITH GFR
ALT: 27 U/L (ref 0–35)
AST: 32 U/L (ref 0–37)
Albumin: 4.3 g/dL (ref 3.5–5.2)
Alkaline Phosphatase: 116 U/L (ref 39–117)
BUN: 9 mg/dL (ref 6–23)
Potassium: 4.5 mEq/L (ref 3.5–5.3)

## 2012-07-09 LAB — LIPID PANEL
Cholesterol: 165 mg/dL (ref 0–200)
LDL Cholesterol: 110 mg/dL — ABNORMAL HIGH (ref 0–99)
Total CHOL/HDL Ratio: 4.5 Ratio
Triglycerides: 88 mg/dL (ref <150)
VLDL: 18 mg/dL (ref 0–40)

## 2012-07-16 ENCOUNTER — Telehealth: Payer: Self-pay | Admitting: *Deleted

## 2012-07-16 DIAGNOSIS — R05 Cough: Secondary | ICD-10-CM

## 2012-07-16 NOTE — Telephone Encounter (Signed)
Pt stated that she is still having a cough there has been no improvement. She would like a referral to a specialist.Colleen [name], Colleen [name]

## 2012-07-16 NOTE — Telephone Encounter (Signed)
Ok referral made 

## 2012-08-12 ENCOUNTER — Encounter: Payer: Self-pay | Admitting: Critical Care Medicine

## 2012-08-12 ENCOUNTER — Ambulatory Visit (INDEPENDENT_AMBULATORY_CARE_PROVIDER_SITE_OTHER): Payer: 59 | Admitting: Critical Care Medicine

## 2012-08-12 VITALS — BP 126/88 | HR 81 | Temp 98.2°F | Ht 64.0 in | Wt 252.0 lb

## 2012-08-12 DIAGNOSIS — R05 Cough: Secondary | ICD-10-CM

## 2012-08-12 MED ORDER — BENZONATATE 100 MG PO CAPS: ORAL_CAPSULE | ORAL | Status: DC

## 2012-08-12 MED ORDER — HYDROCODONE-HOMATROPINE 5-1.5 MG/5ML PO SYRP: 5.0000 mL | ORAL_SOLUTION | Freq: Four times a day (QID) | ORAL | Status: DC | PRN

## 2012-08-12 MED ORDER — CHLORPHENIRAMINE MALEATE 4 MG PO TABS: 8.0000 mg | ORAL_TABLET | Freq: Every day | ORAL | Status: DC

## 2012-08-12 NOTE — Patient Instructions (Addendum)
Start cough protocol using benzonatate and hycodan cough syrup Start chlorpheniramine 8mg  at bedtime Stay on xyzal  Allergy labs  Return 1 month

## 2012-08-12 NOTE — Progress Notes (Signed)
Subjective:    Patient ID: Colleen [name], female    DOB: [birth year], 42 y.o.   MRN: [id number]  HPI Comments: Chronic cough x 6months.  Cough is mostly dry. No specific ppt factor.  ?issues with bp meds, if changed helps a bit then comes back  Cough This is a new problem. The current episode started more than 1 month ago. The problem has been gradually worsening. The problem occurs hourly (ppt by talking, worse as day goes on, no qhs cough). The cough is non-productive. Associated symptoms include shortness of breath and wheezing. Pertinent negatives include no chest pain, chills, ear congestion, ear pain, fever, headaches, heartburn, hemoptysis, myalgias, nasal congestion, postnasal drip, rash, rhinorrhea, sore throat, sweats or weight loss. 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 She has tried prescription cough suppressant for the symptoms. The treatment provided mild relief. There is no history of asthma, bronchiectasis, bronchitis, COPD, emphysema, environmental allergies or pneumonia.  No relief with PPI in the past  Past Medical History  Diagnosis Date  . Obesity   . Hypertension   . Fatty liver disease, nonalcoholic   . Obesity   . Cough      Family History  Problem Relation Age of Onset  . Hypertension Mother   . Diabetes Mother   . Thyroid disease Mother   . Cancer Father   . Asthma Mother   . Asthma Sister      History   Social History  . Marital Status: Married    Spouse Name: N/A    Number of Children: 2  . Years of Education: N/A   Occupational History  . Social Worker    Social History Main Topics  . Smoking status: Never Smoker   . Smokeless tobacco: Never Used  . Alcohol Use: Yes     Comment: once every 1-2 months  . Drug Use: No  . Sexually Active: Yes     Comment: nuva ring for birth control   Other Topics Concern   . Not on file   Social History Narrative  . No narrative on file     Allergies  Allergen Reactions  . Ace Inhibitors     REACTION: cough     Outpatient Prescriptions Prior to Visit  Medication Sig Dispense Refill  . BYSTOLIC 10 MG tablet TAKE 1 TABLET (10 MG TOTAL) BY MOUTH DAILY.  30 tablet  1  . levonorgestrel (MIRENA) 20 MCG/24HR IUD 1 each by Intrauterine route once.      Marland Kitchen levocetirizine (XYZAL) 5 MG tablet TAKE 1 TABLET BY MOUTH EVERY DAY  30 tablet  1  . QNASL 80 MCG/ACT AERS Place 2 sprays into both nostrils daily.  1 Inhaler  11  . HYDROcodone-homatropine (HYCODAN) 5-1.5 MG/5ML syrup Take 5 mLs by mouth every 8 (eight) hours as needed for cough.  180 mL  0   No facility-administered medications prior to visit.      Review of Systems  Constitutional: Positive for fatigue. Negative for fever, chills, weight loss, diaphoresis, activity change, appetite change and unexpected weight change.  HENT: Negative for hearing loss, ear pain, nosebleeds, congestion, sore throat, facial swelling, rhinorrhea, sneezing, mouth sores, trouble swallowing, neck pain, neck stiffness, dental problem, voice change, postnasal drip, sinus pressure, tinnitus and ear discharge.   Eyes: Negative for photophobia, discharge, itching and visual disturbance.  Respiratory: Positive for cough, shortness of  breath and wheezing. Negative for apnea, hemoptysis, choking, chest tightness and stridor.   Cardiovascular: Negative for chest pain, palpitations and leg swelling.  Gastrointestinal: Negative for heartburn, nausea, vomiting, abdominal pain, constipation, blood in stool and abdominal distention.  Genitourinary: Negative for dysuria, urgency, frequency, hematuria, flank pain, decreased urine volume and difficulty urinating.  Musculoskeletal: Positive for back pain. Negative for myalgias, joint swelling, arthralgias and gait problem.  Skin: Negative for color change, pallor and rash.   Allergic/Immunologic: Negative for environmental allergies.  Neurological: Positive for dizziness. Negative for tremors, seizures, syncope, speech difficulty, weakness, light-headedness, numbness and headaches.  Hematological: Negative for adenopathy. Does not bruise/bleed easily.  Psychiatric/Behavioral: Negative for confusion, sleep disturbance and agitation. The patient is not nervous/anxious.        Objective:   Physical Exam Filed Vitals:   08/12/12 1602  BP: 126/88  Pulse: 81  Temp: 98.2 F (36.8 C)  TempSrc: Oral  Height: 5\' 4"  (1.626 m)  Weight: 252 lb (114.306 kg)  SpO2: 99%    Gen: Pleasant, well-nourished, in no distress,  normal affect  ENT: No lesions,  mouth clear,  oropharynx clear, no +++postnasal drip  Neck: No JVD, no TMG, no carotid bruits  Lungs: No use of accessory muscles, no dullness to percussion, clear without rales or rhonchi  Cardiovascular: RRR, heart sounds normal, no murmur or gallops, no peripheral edema  Abdomen: soft and NT, no HSM,  BS normal  Musculoskeletal: No deformities, no cyanosis or clubbing  Neuro: alert, non focal  Skin: Warm, no lesions or rashes  No results found. 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 BYSTOLIC 10 MG tablet TAKE 1 TABLET (10 MG TOTAL) BY MOUTH DAILY.  30 tablet  1  . levonorgestrel (MIRENA) 20 MCG/24HR IUD 1 each by Intrauterine route once.      . benzonatate (TESSALON) 100 MG capsule Take 1-2 every 4 hours as needed for cough per cough protocol  90 capsule  4  .  chlorpheniramine (CHLOR-TRIMETON) 4 MG tablet Take 2 tablets (8 mg total) by mouth at bedtime.  60 tablet  4  . HYDROcodone-homatropine (HYCODAN) 5-1.5 MG/5ML syrup Take 5 mLs by mouth every 6 (six) hours as needed for cough.  240 mL  0  . levocetirizine (XYZAL) 5 MG tablet TAKE 1 TABLET BY MOUTH EVERY DAY  30 tablet  1  . QNASL 80 MCG/ACT AERS Place 2 sprays into both nostrils daily.  1 Inhaler  11  . [DISCONTINUED] HYDROcodone-homatropine (HYCODAN) 5-1.5 MG/5ML syrup Take 5 mLs by mouth every 8 (eight) hours as needed for cough.  180 mL  0   No facility-administered encounter medications on file as of 08/12/2012.

## 2012-08-13 DIAGNOSIS — R059 Cough, unspecified: Secondary | ICD-10-CM | POA: Insufficient documentation

## 2012-08-13 DIAGNOSIS — R05 Cough: Secondary | ICD-10-CM | POA: Insufficient documentation

## 2012-08-13 LAB — ALLERGY FULL PROFILE
Aspergillus fumigatus, m3: 0.11 kU/L — ABNORMAL HIGH
Bahia Grass: 21.9 kU/L — ABNORMAL HIGH
Bermuda Grass: 9.26 kU/L — ABNORMAL HIGH
Candida Albicans: 0.46 kU/L — ABNORMAL HIGH
Cat Dander: 1.14 kU/L — ABNORMAL HIGH
Curvularia lunata: <0.1 kU/L
D. farinae: 0.59 kU/L — ABNORMAL HIGH
Elm IgE: 0.81 kU/L — ABNORMAL HIGH
Fescue: 52.2 kU/L — ABNORMAL HIGH
G009 Red Top: 41.6 kU/L — ABNORMAL HIGH
Oak: 0.84 kU/L — ABNORMAL HIGH
Sycamore Tree: 0.68 kU/L — ABNORMAL HIGH
Timothy Grass: 36 kU/L — ABNORMAL HIGH

## 2012-08-13 NOTE — Progress Notes (Signed)
Quick Note:  Called, spoke with pt. Informed her of lab results per Dr. Wright. She verbalized understanding. ______ 

## 2012-08-13 NOTE — Assessment & Plan Note (Signed)
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

## 2012-09-20 ENCOUNTER — Ambulatory Visit (INDEPENDENT_AMBULATORY_CARE_PROVIDER_SITE_OTHER): Payer: 59 | Admitting: Critical Care Medicine

## 2012-09-20 ENCOUNTER — Encounter: Payer: Self-pay | Admitting: Critical Care Medicine

## 2012-09-20 VITALS — BP 124/80 | HR 80 | Temp 98.2°F | Ht 64.0 in | Wt 252.0 lb

## 2012-09-20 DIAGNOSIS — R05 Cough: Secondary | ICD-10-CM

## 2012-09-20 NOTE — Patient Instructions (Addendum)
Get chlorpheniramine 4mg  and take 8mg  (two tabs) at bedtime, this is over the counter Stay on Qnasl Stay on xyzal Keep sugar free candy drop in mouth as much as possible in daytime for 2-3 more weeks Benzonatate as needed Return 4 months

## 2012-09-20 NOTE — Progress Notes (Signed)
Subjective:    Patient ID: Colleen [name], female    DOB: 1970-07-22, 42 y.o.   MRN: [id number]  HPI  09/20/2012 Chief Complaint  Patient presents with  . 1 month follow up    Cough has resolved but still clearing throat and voice changes. No SOB, wheezing, chest tightness, or chest pain.   Cough is better. Still clearing throat a lot and hoarse. Not coughing now.  Pt still with pndrip.  Not taking chlorpheniramine. Still clearing the throat.  Still hoarse.   Past Medical History  Diagnosis Date  . Obesity   . Hypertension   . Fatty liver disease, nonalcoholic   . Obesity   . Cough      Family History  Problem Relation Age of Onset  . Hypertension Mother   . Diabetes Mother   . Thyroid disease Mother   . Cancer Father   . Asthma Mother   . Asthma Sister      History   Social History  . Marital Status: Married    Spouse Name: N/A    Number of Children: 2  . Years of Education: N/A   Occupational History  . Social Worker    Social History Main Topics  . Smoking status: Never Smoker   . Smokeless tobacco: Never Used  . Alcohol Use: Yes     Comment: once every 1-2 months  . Drug Use: No  . Sexual Activity: Yes     Comment: nuva ring for birth control   Other Topics Concern  . Not on file   Social History Narrative  . No narrative on file     Allergies  Allergen Reactions  . Ace Inhibitors     REACTION: cough     Outpatient Prescriptions Prior to Visit  Medication Sig Dispense Refill  . benzonatate (TESSALON) 100 MG capsule Take 1-2 every 4 hours as needed for cough per cough protocol  90 capsule  4  . BYSTOLIC 10 MG tablet TAKE 1 TABLET (10 MG TOTAL) BY MOUTH DAILY.  30 tablet  1  . HYDROcodone-homatropine (HYCODAN) 5-1.5 MG/5ML syrup Take 5 mLs by mouth every 6 (six) hours as needed for cough.  240 mL  0  . levocetirizine (XYZAL) 5 MG tablet TAKE 1 TABLET BY MOUTH EVERY DAY  30 tablet  1  . QNASL 80 MCG/ACT AERS Place 2 sprays into both  nostrils daily.  1 Inhaler  11  . chlorpheniramine (CHLOR-TRIMETON) 4 MG tablet Take 2 tablets (8 mg total) by mouth at bedtime.  60 tablet  4  . levonorgestrel (MIRENA) 20 MCG/24HR IUD 1 each by Intrauterine route once.       No facility-administered medications prior to visit.      Review of Systems  Constitutional: Positive for fatigue. Negative for diaphoresis, activity change, appetite change and unexpected weight change.  HENT: Negative for hearing loss, nosebleeds, congestion, facial swelling, sneezing, mouth sores, trouble swallowing, neck pain, neck stiffness, dental problem, voice change, sinus pressure, tinnitus and ear discharge.   Eyes: Negative for photophobia, discharge, itching and visual disturbance.  Respiratory: Negative for apnea, choking, chest tightness and stridor.   Cardiovascular: Negative for palpitations and leg swelling.  Gastrointestinal: Negative for nausea, vomiting, abdominal pain, constipation, blood in stool and abdominal distention.  Genitourinary: Negative for dysuria, urgency, frequency, hematuria, flank pain, decreased urine volume and difficulty urinating.  Musculoskeletal: Positive for back pain. Negative for joint swelling, arthralgias and gait problem.  Skin: Negative for color change and  pallor.  Neurological: Positive for dizziness. Negative for tremors, seizures, syncope, speech difficulty, weakness, light-headedness and numbness.  Hematological: Negative for adenopathy. Does not bruise/bleed easily.  Psychiatric/Behavioral: Negative for confusion, sleep disturbance and agitation. The patient is not nervous/anxious.        Objective:   Physical Exam  Filed Vitals:   09/20/12 1507  BP: 124/80  Pulse: 80  Temp: 98.2 F (36.8 C)  TempSrc: Oral  Height: 5\' 4"  (1.626 m)  Weight: 252 lb (114.306 kg)  SpO2: 97%    Gen: Pleasant, well-nourished, in no distress,  normal affect  ENT: No lesions,  mouth clear,  oropharynx clear,  +postnasal  drip  Neck: No JVD, no TMG, no carotid bruits  Lungs: No use of accessory muscles, no dullness to percussion, clear without rales or rhonchi  Cardiovascular: RRR, heart sounds normal, no murmur or gallops, no peripheral edema  Abdomen: soft and NT, no HSM,  BS normal  Musculoskeletal: No deformities, no cyanosis or clubbing  Neuro: alert, non focal  Skin: Warm, no lesions or rashes  No results found. RAST assay Pos IgE 330  Multiple pos allergy testing       Assessment & Plan:   Cough Cyclical cough on the basis of postnasal drip syndrome from allergic rhinitis with multiple positive allergies on rashes assay seen in high IgE levels Cough is now improved Plan Get chlorpheniramine 4mg  and take 8mg  (two tabs) at bedtime, this is over the counter Stay on Qnasl Stay on xyzal Keep sugar free candy drop in mouth as much as possible in daytime for 2-3 more weeks Benzonatate as needed Return 4 months     Updated Medication List Outpatient Encounter Prescriptions as of 09/20/2012  Medication Sig Dispense Refill  . benzonatate (TESSALON) 100 MG capsule Take 1-2 every 4 hours as needed for cough per cough protocol  90 capsule  4  . BYSTOLIC 10 MG tablet TAKE 1 TABLET (10 MG TOTAL) BY MOUTH DAILY.  30 tablet  1  . HYDROcodone-homatropine (HYCODAN) 5-1.5 MG/5ML syrup Take 5 mLs by mouth every 6 (six) hours as needed for cough.  240 mL  0  . levocetirizine (XYZAL) 5 MG tablet TAKE 1 TABLET BY MOUTH EVERY DAY  30 tablet  1  . QNASL 80 MCG/ACT AERS Place 2 sprays into both nostrils daily.  1 Inhaler  11  . [DISCONTINUED] chlorpheniramine (CHLOR-TRIMETON) 4 MG tablet Take 2 tablets (8 mg total) by mouth at bedtime.  60 tablet  4  . chlorpheniramine (CHLOR-TRIMETON) 4 MG tablet Take 8 mg by mouth at bedtime.       Marland Kitchen levonorgestrel (MIRENA) 20 MCG/24HR IUD 1 each by Intrauterine route once.       No facility-administered encounter medications on file as of 09/20/2012.

## 2012-09-20 NOTE — Assessment & Plan Note (Signed)
Cyclical cough on the basis of postnasal drip syndrome from allergic rhinitis with multiple positive allergies on rashes assay seen in high IgE levels Cough is now improved Plan Get chlorpheniramine 4mg  and take 8mg  (two tabs) at bedtime, this is over the counter Stay on Qnasl Stay on xyzal Keep sugar free candy drop in mouth as much as possible in daytime for 2-3 more weeks Benzonatate as needed Return 4 months

## 2012-09-24 ENCOUNTER — Other Ambulatory Visit: Payer: Self-pay | Admitting: Family Medicine

## 2012-11-22 ENCOUNTER — Other Ambulatory Visit: Payer: Self-pay | Admitting: Family Medicine

## 2012-12-09 ENCOUNTER — Other Ambulatory Visit: Payer: Self-pay

## 2012-12-21 ENCOUNTER — Other Ambulatory Visit: Payer: Self-pay | Admitting: *Deleted

## 2012-12-21 MED ORDER — LEVOCETIRIZINE DIHYDROCHLORIDE 5 MG PO TABS: ORAL_TABLET | ORAL | Status: DC

## 2012-12-21 MED ORDER — BENZONATATE 100 MG PO CAPS: ORAL_CAPSULE | ORAL | Status: DC

## 2012-12-22 ENCOUNTER — Other Ambulatory Visit: Payer: Self-pay | Admitting: *Deleted

## 2012-12-22 DIAGNOSIS — J309 Allergic rhinitis, unspecified: Secondary | ICD-10-CM

## 2012-12-22 MED ORDER — QNASL 80 MCG/ACT NA AERS: 2.0000 | INHALATION_SPRAY | Freq: Every day | NASAL | Status: DC

## 2013-02-22 ENCOUNTER — Ambulatory Visit (INDEPENDENT_AMBULATORY_CARE_PROVIDER_SITE_OTHER): Payer: 59 | Admitting: Critical Care Medicine

## 2013-02-22 ENCOUNTER — Encounter: Payer: Self-pay | Admitting: Critical Care Medicine

## 2013-02-22 VITALS — BP 118/60 | HR 74 | Temp 98.2°F | Ht 64.0 in | Wt 256.0 lb

## 2013-02-22 DIAGNOSIS — J309 Allergic rhinitis, unspecified: Secondary | ICD-10-CM

## 2013-02-22 DIAGNOSIS — R059 Cough, unspecified: Secondary | ICD-10-CM

## 2013-02-22 DIAGNOSIS — R05 Cough: Secondary | ICD-10-CM

## 2013-02-22 MED ORDER — TRIAMCINOLONE ACETONIDE 55 MCG/ACT NA AERO: INHALATION_SPRAY | NASAL | Status: DC

## 2013-02-22 MED ORDER — CHLORPHENIRAMINE MALEATE 4 MG PO TABS: 8.0000 mg | ORAL_TABLET | Freq: Every day | ORAL | Status: DC

## 2013-02-22 NOTE — Assessment & Plan Note (Signed)
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

## 2013-02-22 NOTE — Progress Notes (Signed)
Subjective:    Patient ID: Colleen [name], female    DOB: 1970/03/21, 43 y.o.   MRN: [id number]  HPI 02/22/2013 Chief Complaint  Patient presents with  . Follow-up    6 mth f/u - Cough is much better - Denies sob, wheezing or chest tightness - Needs cheaper alternaitive for nasal spray ( other than nasonex and flonase)  Cough is improved. Pt using chlorpheniramine/qnasl/zyrtec Issues with copay on qnasl. Overall much better Pt denies any significant sore throat, nasal congestion or excess secretions, fever, chills, sweats, unintended weight loss, pleurtic or exertional chest pain, orthopnea PND, or leg swelling Pt denies any increase in rescue therapy over baseline, denies waking up needing it or having any early am or nocturnal exacerbations of coughing/wheezing/or dyspnea. Pt also denies any obvious fluctuation in symptoms with  weather or environmental change or other alleviating or aggravating factors    Past Medical History  Diagnosis Date  . Obesity   . Hypertension   . Fatty liver disease, nonalcoholic   . Obesity   . Cough      Family History  Problem Relation Age of Onset  . Hypertension Mother   . Diabetes Mother   . Thyroid disease Mother   . Cancer Father   . Asthma Mother   . Asthma Sister      History   Social History  . Marital Status: Married    Spouse Name: N/A    Number of Children: 2  . Years of Education: N/A   Occupational History  . Social Worker    Social History Main Topics  . Smoking status: Never Smoker   . Smokeless tobacco: Never Used  . Alcohol Use: Yes     Comment: once every 1-2 months  . Drug Use: No  . Sexual Activity: Yes     Comment: nuva ring for birth control   Other Topics Concern  . Not on file   Social History Narrative  . No narrative on file     Allergies  Allergen Reactions  . Ace Inhibitors     REACTION: cough     Outpatient Prescriptions Prior to Visit  Medication Sig Dispense Refill  . BYSTOLIC  10 MG tablet TAKE 1 TABLET (10 MG TOTAL) BY MOUTH DAILY.  30 tablet  1  . levocetirizine (XYZAL) 5 MG tablet TAKE 1 TABLET BY MOUTH EVERY DAY  90 tablet  1  . levonorgestrel (MIRENA) 20 MCG/24HR IUD 1 each by Intrauterine route once.      . chlorpheniramine (CHLOR-TRIMETON) 4 MG tablet Take 8 mg by mouth at bedtime.       . benzonatate (TESSALON) 100 MG capsule Take 1-2 every 4 hours as needed for cough per cough protocol  90 capsule  2  . HYDROcodone-homatropine (HYCODAN) 5-1.5 MG/5ML syrup Take 5 mLs by mouth every 6 (six) hours as needed for cough.  240 mL  0  . QNASL 80 MCG/ACT AERS Place 2 sprays into both nostrils daily.  3 Inhaler  4   No facility-administered medications prior to visit.      Review of Systems  Constitutional: Positive for fatigue. Negative for diaphoresis, activity change, appetite change and unexpected weight change.  HENT: Negative for congestion, dental problem, ear discharge, facial swelling, hearing loss, mouth sores, nosebleeds, sinus pressure, sneezing, tinnitus, trouble swallowing and voice change.   Eyes: Negative for photophobia, discharge, itching and visual disturbance.  Respiratory: Negative for apnea, choking, chest tightness and stridor.   Cardiovascular: Negative for  palpitations and leg swelling.  Gastrointestinal: Negative for nausea, vomiting, abdominal pain, constipation, blood in stool and abdominal distention.  Genitourinary: Negative for dysuria, urgency, frequency, hematuria, flank pain, decreased urine volume and difficulty urinating.  Musculoskeletal: Positive for back pain. Negative for arthralgias, gait problem, joint swelling, neck pain and neck stiffness.  Skin: Negative for color change and pallor.  Neurological: Positive for dizziness. Negative for tremors, seizures, syncope, speech difficulty, weakness, light-headedness and numbness.  Hematological: Negative for adenopathy. Does not bruise/bleed easily.  Psychiatric/Behavioral:  Negative for confusion, sleep disturbance and agitation. The patient is not nervous/anxious.        Objective:   Physical Exam  Filed Vitals:   02/22/13 1200  BP: 118/60  Pulse: 74  Temp: 98.2 F (36.8 C)  TempSrc: Oral  Height: 5\' 4"  (1.626 m)  Weight: 256 lb (116.121 kg)  SpO2: 97%    Gen: Pleasant, well-nourished, in no distress,  normal affect  ENT: No lesions,  mouth clear,  oropharynx clear,  +postnasal drip  Neck: No JVD, no TMG, no carotid bruits  Lungs: No use of accessory muscles, no dullness to percussion, clear   Cardiovascular: RRR, heart sounds normal, no murmur or gallops, no peripheral edema  Abdomen: soft and NT, no HSM,  BS normal  Musculoskeletal: No deformities, no cyanosis or clubbing  Neuro: alert, non focal  Skin: Warm, no lesions or rashes  No results found. RAST assay Pos IgE 330  Multiple pos allergy testing       Assessment & Plan:   Allergic rhinitis, cause unspecified Severe persistent allergic rhinitis with postnasal drip syndrome Plan Trial nasacort   Cough Cyclical cough on the basis of postnasal drip syndrome from allergic rhinitis  all above led to cyclical cough. Now resolved Plan to try nasacort, cont antihistamines  Updated Medication List Outpatient Encounter Prescriptions as of 02/22/2013  Medication Sig  . BYSTOLIC 10 MG tablet TAKE 1 TABLET (10 MG TOTAL) BY MOUTH DAILY.  . chlorpheniramine (CHLOR-TRIMETON) 4 MG tablet Take 2 tablets (8 mg total) by mouth at bedtime.  Marland Kitchen levocetirizine (XYZAL) 5 MG tablet TAKE 1 TABLET BY MOUTH EVERY DAY  . levonorgestrel (MIRENA) 20 MCG/24HR IUD 1 each by Intrauterine route once.  . [DISCONTINUED] chlorpheniramine (CHLOR-TRIMETON) 4 MG tablet Take 8 mg by mouth at bedtime.   . benzonatate (TESSALON) 100 MG capsule Take 1-2 every 4 hours as needed for cough per cough protocol  . HYDROcodone-homatropine (HYCODAN) 5-1.5 MG/5ML syrup Take 5 mLs by mouth every 6 (six) hours as needed  for cough.  . triamcinolone (NASACORT AQ) 55 MCG/ACT AERO nasal inhaler OTC  . [DISCONTINUED] QNASL 80 MCG/ACT AERS Place 2 sprays into both nostrils daily.

## 2013-02-22 NOTE — Patient Instructions (Signed)
Try Nasacort AQ OTC two puff ea nostril daily No other change in medications Return 6 months

## 2013-02-22 NOTE — Assessment & Plan Note (Signed)
Severe persistent allergic rhinitis with postnasal drip syndrome Plan Trial nasacort

## 2013-05-06 ENCOUNTER — Other Ambulatory Visit: Payer: Self-pay | Admitting: Physician Assistant

## 2013-07-11 ENCOUNTER — Other Ambulatory Visit: Payer: Self-pay | Admitting: Family Medicine

## 2013-09-13 ENCOUNTER — Other Ambulatory Visit: Payer: Self-pay | Admitting: Family Medicine

## 2013-09-13 ENCOUNTER — Other Ambulatory Visit: Payer: Self-pay | Admitting: *Deleted

## 2013-09-13 MED ORDER — NEBIVOLOL HCL 10 MG PO TABS: ORAL_TABLET | ORAL | Status: DC

## 2013-09-20 ENCOUNTER — Other Ambulatory Visit: Payer: Self-pay | Admitting: *Deleted

## 2013-09-20 ENCOUNTER — Telehealth: Payer: Self-pay | Admitting: *Deleted

## 2013-09-20 MED ORDER — NEBIVOLOL HCL 10 MG PO TABS: ORAL_TABLET | ORAL | Status: DC

## 2013-09-20 NOTE — Telephone Encounter (Signed)
Pt called and lvm regarding refill for bystolic she stated that it was sent for a 30 day supply to her mail order and it was supposed to be for 90. I tried to call and inform pt that she will need to come in to be seen however her vm was full and I was unable to leave a vm.Colleen [name], Colleen [name]

## 2013-09-30 ENCOUNTER — Ambulatory Visit (INDEPENDENT_AMBULATORY_CARE_PROVIDER_SITE_OTHER): Payer: 59 | Admitting: Family Medicine

## 2013-09-30 ENCOUNTER — Encounter: Payer: Self-pay | Admitting: Family Medicine

## 2013-09-30 VITALS — BP 123/76 | HR 73 | Ht 64.0 in | Wt 257.0 lb

## 2013-09-30 DIAGNOSIS — Z23 Encounter for immunization: Secondary | ICD-10-CM

## 2013-09-30 DIAGNOSIS — K7689 Other specified diseases of liver: Secondary | ICD-10-CM

## 2013-09-30 DIAGNOSIS — E785 Hyperlipidemia, unspecified: Secondary | ICD-10-CM

## 2013-09-30 DIAGNOSIS — I10 Essential (primary) hypertension: Secondary | ICD-10-CM

## 2013-09-30 MED ORDER — NEBIVOLOL HCL 10 MG PO TABS: ORAL_TABLET | ORAL | Status: DC

## 2013-09-30 MED ORDER — NEBIVOLOL HCL 5 MG PO TABS: 10.0000 mg | ORAL_TABLET | Freq: Every day | ORAL | Status: DC

## 2013-09-30 NOTE — Assessment & Plan Note (Signed)
Due to recheck liver enzymesl

## 2013-09-30 NOTE — Progress Notes (Signed)
   Subjective:    Patient ID: Colleen [name], female    DOB: 21-Dec-1970, 43 y.o.   MRN: [id number]  Hypertension   Hypertension- Pt denies chest pain, SOB, dizziness, or heart palpitations.  Taking meds as directed w/o problems.  Denies medication side effects.  On the Bystolic 10 mg daily.  Hyperlipidemia - not on a statin. Last lipid panel about a year and half ago.  Fatty liver - hx of fatty liver.  Due to recheck liver .  No tylenol products.  Occ alcohol intake.   Review of Systems     Objective:   Physical Exam  Constitutional: She is oriented to person, place, and time. She appears well-developed and well-nourished.  HENT:  Head: Normocephalic and atraumatic.  Cardiovascular: Normal rate, regular rhythm and normal heart sounds.   Pulmonary/Chest: Effort normal and breath sounds normal.  Neurological: She is alert and oriented to person, place, and time.  Skin: Skin is warm and dry.  Psychiatric: She has a normal mood and affect. Her behavior is normal.          Assessment & Plan:

## 2013-09-30 NOTE — Assessment & Plan Note (Signed)
Due to recheck lipids. 

## 2013-09-30 NOTE — Assessment & Plan Note (Signed)
Well-controlled on current regimen. Due for CMP and fasting lipid panel. Followup in 6 months. Refill sent to pharmacy.

## 2013-10-18 LAB — COMPLETE METABOLIC PANEL WITH GFR
ALBUMIN: 4 g/dL (ref 3.5–5.2)
ALK PHOS: 111 U/L (ref 39–117)
ALT: 34 U/L (ref 0–35)
AST: 46 U/L — ABNORMAL HIGH (ref 0–37)
BUN: 8 mg/dL (ref 6–23)
CO2: 25 mEq/L (ref 19–32)
Calcium: 9.1 mg/dL (ref 8.4–10.5)
Chloride: 107 mEq/L (ref 96–112)
Creat: 0.89 mg/dL (ref 0.50–1.10)
GFR, EST NON AFRICAN AMERICAN: 80 mL/min
GFR, Est African American: >89 mL/min
GLUCOSE: 79 mg/dL (ref 70–99)
POTASSIUM: 4.4 meq/L (ref 3.5–5.3)
Sodium: 138 mEq/L (ref 135–145)
Total Bilirubin: 0.4 mg/dL (ref 0.2–1.2)
Total Protein: 7.2 g/dL (ref 6.0–8.3)

## 2013-10-18 LAB — LIPID PANEL
CHOL/HDL RATIO: 3.8 ratio
Cholesterol: 142 mg/dL (ref 0–200)
HDL: 37 mg/dL — ABNORMAL LOW (ref >39)
LDL Cholesterol: 90 mg/dL (ref 0–99)
Triglycerides: 76 mg/dL (ref <150)
VLDL: 15 mg/dL (ref 0–40)

## 2013-10-19 ENCOUNTER — Other Ambulatory Visit: Payer: Self-pay | Admitting: *Deleted

## 2013-10-19 DIAGNOSIS — R748 Abnormal levels of other serum enzymes: Secondary | ICD-10-CM

## 2013-12-01 LAB — HM MAMMOGRAPHY: HM Mammogram: NORMAL

## 2013-12-01 LAB — HM PAP SMEAR: HM PAP: NORMAL

## 2014-02-12 ENCOUNTER — Other Ambulatory Visit: Payer: Self-pay | Admitting: Family Medicine

## 2014-06-06 ENCOUNTER — Other Ambulatory Visit: Payer: Self-pay | Admitting: Family Medicine

## 2014-07-04 ENCOUNTER — Encounter: Payer: Self-pay | Admitting: *Deleted

## 2014-07-04 ENCOUNTER — Emergency Department (INDEPENDENT_AMBULATORY_CARE_PROVIDER_SITE_OTHER): Payer: 59

## 2014-07-04 ENCOUNTER — Emergency Department
Admission: EM | Admit: 2014-07-04 | Discharge: 2014-07-04 | Disposition: A | Discharge status: Home / Self Care | Payer: 59 | Source: Home / Self Care | Attending: Emergency Medicine | Admitting: Emergency Medicine

## 2014-07-04 DIAGNOSIS — S62660A Nondisplaced fracture of distal phalanx of right index finger, initial encounter for closed fracture: Secondary | ICD-10-CM

## 2014-07-04 DIAGNOSIS — X58XXXA Exposure to other specified factors, initial encounter: Secondary | ICD-10-CM

## 2014-07-04 DIAGNOSIS — S62609A Fracture of unspecified phalanx of unspecified finger, initial encounter for closed fracture: Secondary | ICD-10-CM

## 2014-07-04 MED ORDER — HYDROCODONE-ACETAMINOPHEN 5-325 MG PO TABS: 2.0000 | ORAL_TABLET | ORAL | Status: DC | PRN

## 2014-07-04 NOTE — Discharge Instructions (Signed)

## 2014-07-04 NOTE — ED Notes (Signed)
Pt was helping move a piano yesterday and has her right index finger caught underneath the piano when trying to move the blanket. Brusiing and pain present.

## 2014-07-04 NOTE — ED Provider Notes (Signed)
CSN: [id number]     Arrival date & time 07/04/14  1043 History   First MD Initiated Contact with Patient 07/04/14 1057     Chief Complaint  Patient presents with  . Finger Injury   (Consider location/radiation/quality/duration/timing/severity/associated sxs/prior Treatment) Patient is a 44 y.o. female presenting with hand pain. The history is provided by the patient. No language interpreter was used.  Hand Pain This is a new problem. The current episode started yesterday. The problem occurs constantly. The problem has been gradually worsening. Pertinent negatives include no shortness of breath. Nothing aggravates the symptoms. Nothing relieves the symptoms. She has tried nothing for the symptoms. The treatment provided no relief.  Pt was moving a piano and finger was caught under paino  Past Medical History  Diagnosis Date  . Obesity   . Hypertension   . Fatty liver disease, nonalcoholic   . Obesity   . Cough    Past Surgical History  Procedure Laterality Date  . Pilonidal cyst excision     Family History  Problem Relation Age of Onset  . Hypertension Mother   . Diabetes Mother   . Thyroid disease Mother   . Cancer Father   . Asthma Mother   . Asthma Sister    History  Substance Use Topics  . Smoking status: Never Smoker   . Smokeless tobacco: Never Used  . Alcohol Use: Yes     Comment: once every 1-2 months   OB History    No data available     Review of Systems  Respiratory: Negative for shortness of breath.   Musculoskeletal: Positive for myalgias and joint swelling.  All other systems reviewed and are negative.   Allergies  Ace inhibitors  Home Medications   Prior to Admission medications   Medication Sig Start Date End Date Taking? Authorizing Provider  chlorpheniramine (CHLOR-TRIMETON) 4 MG tablet Take 2 tablets (8 mg total) by mouth at bedtime. 02/22/13   Storm Frisk, MD  levocetirizine (XYZAL) 5 MG tablet Take 1 tablet by mouth every day **OFFICE  APPOINTMENT NEEDED FOR REFILLS** 02/13/14   Agapito Games, MD  levonorgestrel (MIRENA) 20 MCG/24HR IUD 1 each by Intrauterine route once.    Historical Provider, MD  nebivolol (BYSTOLIC) 10 MG tablet Take 1 tablet by mouth every day **OFFICE APPOINTMENT NEEDED FOR REFILLS** 02/13/14   Agapito Games, MD  nebivolol (BYSTOLIC) 5 MG tablet Take 2 tablets (10 mg total) by mouth daily. 09/30/13 10/31/13  Agapito Games, MD  triamcinolone (NASACORT AQ) 55 MCG/ACT AERO nasal inhaler OTC 02/22/13   Storm Frisk, MD   BP 134/78 mmHg  Pulse 67  Resp 16  Ht  (1.626 m)  Wt 250 lb (113.399 kg)  BMI 42.89 kg/m2  SpO2 97% Physical Exam  Constitutional: She is oriented to person, place, and time.  Musculoskeletal: Normal range of motion. She exhibits tenderness.  Swollen distal finger,  Pain with movement,  nv and ns intact  Neurological: She is alert and oriented to person, place, and time. She has normal reflexes.  Skin: Skin is warm.  Psychiatric: She has a normal mood and affect.  Nursing note and vitals reviewed.   ED Course  Procedures (including critical care time) Labs Review Labs Reviewed - No data to display  Imaging Review Dg Finger Index Right  07/04/2014   CLINICAL DATA:  Finger smashed by piano  EXAM: RIGHT SECOND FINGER 2+V  COMPARISON:  None.  FINDINGS: Frontal, oblique, and lateral  views were obtained. There is a nondisplaced transversely oriented fracture of the distal aspect of the second distal phalanx. No other fracture. No dislocation. Joint spaces appear intact.  IMPRESSION: Nondisplaced fracture distal aspect second distal phalanx.   Electronically Signed   By: Bretta BangWilliam  Woodruff III M.D.   On: 07/04/2014 11:34     MDM   1. Closed fracture of phalanx or phalanges of hand, initial encounter    Splint Ibuprofen See Dr. Karie Schwalbe for recheck in 1 week Ice and elevate    Lonia SkinnerLeslie K SesserSofia, [place] JerseyPA-C 07/04/14 1203

## 2014-07-12 ENCOUNTER — Ambulatory Visit (INDEPENDENT_AMBULATORY_CARE_PROVIDER_SITE_OTHER): Payer: 59 | Admitting: Sports Medicine

## 2014-07-12 ENCOUNTER — Encounter: Payer: Self-pay | Admitting: Sports Medicine

## 2014-07-12 VITALS — BP 131/86 | HR 65 | Ht 64.0 in | Wt 249.0 lb

## 2014-07-12 DIAGNOSIS — IMO0001 Reserved for inherently not codable concepts without codable children: Secondary | ICD-10-CM | POA: Insufficient documentation

## 2014-07-12 DIAGNOSIS — S62630A Displaced fracture of distal phalanx of right index finger, initial encounter for closed fracture: Secondary | ICD-10-CM | POA: Diagnosis not present

## 2014-07-12 MED ORDER — HYDROCODONE-ACETAMINOPHEN 5-325 MG PO TABS: 1.0000 | ORAL_TABLET | Freq: Two times a day (BID) | ORAL | Status: DC | PRN

## 2014-07-12 NOTE — Progress Notes (Signed)
   Subjective:    I'm seeing this patient as a consultation for:  Cheron SchaumannLeslie Sofia PA-C  CC: Finger injury  HPI: This is a pleasant 44 year old female, 1-1/2 weeks ago she was moving a piano, it slammed down on the distal phalanx of her right second finger, she had immediate pain, swelling, bruising. She was seen in urgent care where x-rays showed a transverse fracture, she was placed in an extension splint and referred to me for further evaluation and definitive treatment. Pain is improved significantly.  Past medical history, Surgical history, Family history not pertinant except as noted below, Social history, Allergies, and medications have been entered into the medical record, reviewed, and no changes needed.   Review of Systems: No headache, visual changes, nausea, vomiting, diarrhea, constipation, dizziness, abdominal pain, skin rash, fevers, chills, night sweats, weight loss, swollen lymph nodes, body aches, joint swelling, muscle aches, chest pain, shortness of breath, mood changes, visual or auditory hallucinations.   Objective:   General: Well Developed, well nourished, and in no acute distress.  Neuro/Psych: Alert and oriented x3, extra-ocular muscles intact, able to move all 4 extremities, sensation grossly intact. Skin: Warm and dry, no rashes noted.  Respiratory: Not using accessory muscles, speaking in full sentences, trachea midline.  Cardiovascular: Pulses palpable, no extremity edema. Abdomen: Does not appear distended. Right hand: Bruising and swelling at the distal phalanx with a subungual hematoma. She does have good motion at the proximal interphalangeal joint.  Staxx splint placed. Splint placed x-rays personally reviewed and show a transverse, nondisplaced fracture through the tip of the distal phalanx.  Impression and Recommendations:   This case required medical decision making of moderate complexity.

## 2014-07-12 NOTE — Assessment & Plan Note (Signed)
Transverse fracture through the tip of the distal phalanx, with a subungual hematoma. Stack splint placed, hydrocodone for pain. Expect 6 weeks for healing. She is 1.5 weeks post fracture. Return to see me in 3 weeks, x-ray before visit.  I billed a fracture code for this encounter, all subsequent visits will be post-op checks in the global period.

## 2014-08-02 ENCOUNTER — Ambulatory Visit (INDEPENDENT_AMBULATORY_CARE_PROVIDER_SITE_OTHER): Payer: 59

## 2014-08-02 DIAGNOSIS — S62630A Displaced fracture of distal phalanx of right index finger, initial encounter for closed fracture: Secondary | ICD-10-CM

## 2014-08-02 DIAGNOSIS — S62630D Displaced fracture of distal phalanx of right index finger, subsequent encounter for fracture with routine healing: Secondary | ICD-10-CM

## 2014-08-02 DIAGNOSIS — X58XXXA Exposure to other specified factors, initial encounter: Secondary | ICD-10-CM

## 2014-08-08 ENCOUNTER — Encounter: Payer: Self-pay | Admitting: Sports Medicine

## 2014-08-08 ENCOUNTER — Encounter: Payer: Self-pay | Admitting: Family Medicine

## 2014-08-08 ENCOUNTER — Ambulatory Visit (INDEPENDENT_AMBULATORY_CARE_PROVIDER_SITE_OTHER): Payer: 59 | Admitting: Sports Medicine

## 2014-08-08 ENCOUNTER — Ambulatory Visit (INDEPENDENT_AMBULATORY_CARE_PROVIDER_SITE_OTHER): Payer: 59 | Admitting: Family Medicine

## 2014-08-08 VITALS — BP 119/74 | HR 64 | Ht 64.0 in | Wt 247.0 lb

## 2014-08-08 DIAGNOSIS — I10 Essential (primary) hypertension: Secondary | ICD-10-CM

## 2014-08-08 DIAGNOSIS — S62630D Displaced fracture of distal phalanx of right index finger, subsequent encounter for fracture with routine healing: Secondary | ICD-10-CM

## 2014-08-08 MED ORDER — NEBIVOLOL HCL 10 MG PO TABS
ORAL_TABLET | ORAL | Status: DC
Start: 2014-08-08 — End: 2015-06-06

## 2014-08-08 NOTE — Progress Notes (Signed)
Called Dr. Donnetta HailNeal's [phone])office and spoke W/Megan in Medical Records and asked that she fax over pt's last pap and mammogram results

## 2014-08-08 NOTE — Progress Notes (Signed)
   Subjective:    Patient ID: Colleen [name], female    DOB: February 20, 1970, 44 y.o.   MRN: [id number]  HPI Hypertension- Pt denies chest pain, SOB, dizziness, or heart palpitations.  Taking meds as directed w/o problems.  Denies medication side effects.    Lab Results  Component Value Date   CHOL 142 10/17/2013   HDL 37* 10/17/2013   LDLCALC 90 10/17/2013   LDLDIRECT 113* 09/12/2010   TRIG 76 10/17/2013   CHOLHDL 3.8 10/17/2013     Review of Systems     Objective:   Physical Exam  Constitutional: She is oriented to person, place, and time. She appears well-developed and well-nourished.  HENT:  Head: Normocephalic and atraumatic.  Cardiovascular: Normal rate, regular rhythm and normal heart sounds.   Pulmonary/Chest: Effort normal and breath sounds normal.  Neurological: She is alert and oriented to person, place, and time.  Skin: Skin is warm and dry.  Psychiatric: She has a normal mood and affect. Her behavior is normal.          Assessment & Plan:  HTN - well controlled. F/u in 6 months. Has lost 10 lbs.   BMI 42 -  Has lost 10 lbs.  Congratulated her on weight loss. She has cut out drinking Coke.

## 2014-08-08 NOTE — Progress Notes (Signed)
  Subjective: Overall doing extremely well 5.5 weeks post transverse fracture of the right second distal phalanx.   Objective: General: Well-developed, well-nourished, and in no acute distress. Right hand: Doing well, good motion, good strength, there is some subungual bruising, finger nail looks like it'll survive, no tenderness.  Assessment/plan:

## 2014-08-08 NOTE — Assessment & Plan Note (Addendum)
Healed, return as needed. 

## 2014-08-09 LAB — BASIC METABOLIC PANEL
BUN: 12 mg/dL (ref 6–23)
CHLORIDE: 105 meq/L (ref 96–112)
CO2: 22 meq/L (ref 19–32)
Calcium: 9.3 mg/dL (ref 8.4–10.5)
Creat: 0.84 mg/dL (ref 0.50–1.10)
GLUCOSE: 94 mg/dL (ref 70–99)
Potassium: 4.8 mEq/L (ref 3.5–5.3)
Sodium: 138 mEq/L (ref 135–145)

## 2014-08-09 NOTE — Progress Notes (Signed)
Quick Note:  All labs are normal. ______ 

## 2014-08-30 ENCOUNTER — Encounter: Payer: Self-pay | Admitting: Family Medicine

## 2014-10-18 ENCOUNTER — Encounter: Payer: Self-pay | Admitting: Family Medicine

## 2014-10-18 ENCOUNTER — Ambulatory Visit (INDEPENDENT_AMBULATORY_CARE_PROVIDER_SITE_OTHER): Payer: 59 | Admitting: Family Medicine

## 2014-10-18 VITALS — BP 121/82 | HR 65 | Temp 98.1°F | Ht 64.0 in | Wt 246.0 lb

## 2014-10-18 DIAGNOSIS — J029 Acute pharyngitis, unspecified: Secondary | ICD-10-CM

## 2014-10-18 DIAGNOSIS — R21 Rash and other nonspecific skin eruption: Secondary | ICD-10-CM

## 2014-10-18 LAB — POCT RAPID STREP A (OFFICE): Rapid Strep A Screen: NEGATIVE

## 2014-10-18 NOTE — Progress Notes (Signed)
   Subjective:    Patient ID: Colleen [name], female    DOB: [birth year], 44 y.o.   MRN: [id number]  HPI Sore Throat x 2 days felt worse last night she took nyquil allergy meds. she reports having mucus drainage as well. No cough.  Roof of the mouth burns.  No eaar pain but some pressure. Chest feels heavy.  No SOB.  No wheezing. No fever, chills.. No diarrhea.  No swollen glands.   Has had a rash on her neck for about 4 days. Started after had dye put in her hair.  Was itching initially. Now feeling better. She has not been treating it. No new lotions or shampoos etc. No prior history of eczema but her son has eczema.  Review of Systems     Objective:   Physical Exam  Constitutional: She is oriented to person, place, and time. She appears well-developed and well-nourished.  HENT:  Head: Normocephalic and atraumatic.  Right Ear: External ear normal.  Left Ear: External ear normal.  Nose: Nose normal.  Mouth/Throat: Oropharynx is clear and moist.  TMs and canals are clear.   Eyes: Conjunctivae and EOM are normal. Pupils are equal, round, and reactive to light.  Neck: Neck supple. No thyromegaly present.  Cardiovascular: Normal rate, regular rhythm and normal heart sounds.   Pulmonary/Chest: Effort normal and breath sounds normal. She has no wheezes.  Lymphadenopathy:    She has no cervical adenopathy.  Neurological: She is alert and oriented to person, place, and time.  Skin: Skin is warm and dry.  3 oval shaped erythematous pink macular lesions with fine scale on the surface on the posterior neck just at the base of the hairline. The largest is approximately 2 cm in diameter.  Psychiatric: She has a normal mood and affect.          Assessment & Plan:  Pharyngitis - neg for strep.  Recommend symptomatic care. Likely early viral upper respiratory infection. Call if not better in one week or call sooner if she feels like she's getting worse or develops a fever. Recommend  over-the-counter zinc lozenges and salt water gargles. Next  Rash-unclear etiology. Could be as secondary to contact dermatitis from recent hair dye. Patches lipoma 6 edematous in nature. She could certainly start by using over-the-counter hydrocortisone area did KOH skin scraping performed. Will call with results once available.

## 2014-10-18 NOTE — Addendum Note (Signed)
Addended by: Deno Etienne on: 10/18/2014 10:58 AM   Modules accepted: Orders

## 2014-10-19 LAB — KOH PREP: RESULT - KOH: NONE SEEN

## 2014-10-20 MED ORDER — TRIAMCINOLONE ACETONIDE 0.5 % EX OINT: 1.0000 "application " | TOPICAL_OINTMENT | Freq: Every day | CUTANEOUS | Status: DC

## 2014-10-20 NOTE — Addendum Note (Signed)
Addended by: Nani Gasser D on: 10/20/2014 07:58 AM   Modules accepted: Orders

## 2015-03-12 ENCOUNTER — Other Ambulatory Visit: Payer: Self-pay | Admitting: Family Medicine

## 2015-03-12 ENCOUNTER — Telehealth: Payer: Self-pay | Admitting: Family Medicine

## 2015-03-12 NOTE — Telephone Encounter (Signed)
Left a message for patient to call back to the office and schedule her follow up appointment

## 2015-06-06 ENCOUNTER — Other Ambulatory Visit: Payer: Self-pay | Admitting: Family Medicine

## 2015-07-13 ENCOUNTER — Encounter: Payer: Self-pay | Admitting: Family Medicine

## 2015-07-13 ENCOUNTER — Ambulatory Visit (INDEPENDENT_AMBULATORY_CARE_PROVIDER_SITE_OTHER): Payer: 59 | Admitting: Family Medicine

## 2015-07-13 VITALS — BP 113/63 | HR 69 | Wt 247.0 lb

## 2015-07-13 DIAGNOSIS — I10 Essential (primary) hypertension: Secondary | ICD-10-CM | POA: Diagnosis not present

## 2015-07-13 DIAGNOSIS — Z114 Encounter for screening for human immunodeficiency virus [HIV]: Secondary | ICD-10-CM | POA: Diagnosis not present

## 2015-07-13 DIAGNOSIS — Z0000 Encounter for general adult medical examination without abnormal findings: Secondary | ICD-10-CM | POA: Diagnosis not present

## 2015-07-13 DIAGNOSIS — E785 Hyperlipidemia, unspecified: Secondary | ICD-10-CM | POA: Diagnosis not present

## 2015-07-13 DIAGNOSIS — Z833 Family history of diabetes mellitus: Secondary | ICD-10-CM

## 2015-07-13 LAB — LIPID PANEL
CHOLESTEROL: 137 mg/dL (ref 125–200)
HDL: 48 mg/dL (ref >=46)
LDL Cholesterol: 74 mg/dL (ref <130)
Total CHOL/HDL Ratio: 2.9 Ratio (ref <=5.0)
Triglycerides: 77 mg/dL (ref <150)
VLDL: 15 mg/dL (ref <30)

## 2015-07-13 LAB — COMPLETE METABOLIC PANEL WITH GFR
ALBUMIN: 3.9 g/dL (ref 3.6–5.1)
ALT: 44 U/L — AB (ref 6–29)
AST: 53 U/L — ABNORMAL HIGH (ref 10–30)
Alkaline Phosphatase: 90 U/L (ref 33–115)
BUN: 12 mg/dL (ref 7–25)
CALCIUM: 9.1 mg/dL (ref 8.6–10.2)
CO2: 23 mmol/L (ref 20–31)
CREATININE: 0.9 mg/dL (ref 0.50–1.10)
Chloride: 107 mmol/L (ref 98–110)
GFR, Est Non African American: 79 mL/min (ref >=60)
Glucose, Bld: 86 mg/dL (ref 65–99)
POTASSIUM: 4.7 mmol/L (ref 3.5–5.3)
Sodium: 140 mmol/L (ref 135–146)
Total Bilirubin: 0.4 mg/dL (ref 0.2–1.2)
Total Protein: 7.4 g/dL (ref 6.1–8.1)

## 2015-07-13 MED ORDER — NEBIVOLOL HCL 10 MG PO TABS: 10.0000 mg | ORAL_TABLET | Freq: Every day | ORAL | Status: DC

## 2015-07-13 MED ORDER — LEVOCETIRIZINE DIHYDROCHLORIDE 5 MG PO TABS: 5.0000 mg | ORAL_TABLET | Freq: Every evening | ORAL | Status: DC

## 2015-07-13 MED ORDER — TRIAMCINOLONE ACETONIDE 55 MCG/ACT NA AERO: INHALATION_SPRAY | NASAL | Status: DC

## 2015-07-13 NOTE — Progress Notes (Signed)
  Subjective:     Colleen [name] is a 45 y.o. female and is here for a comprehensive physical exam. The patient reports no problems. She see gyn, [facility].  Had pap and mammo in Jan 2017.    No regular exercise at this time but plans on starting to work out at the gym.     Social History   Social History  . Marital Status: Married    Spouse Name: N/A  . Number of Children: 2  . Years of Education: N/A   Occupational History  . Social Worker    Social History Main Topics  . Smoking status: Never Smoker   . Smokeless tobacco: Never Used  . Alcohol Use: Yes     Comment: once every 1-2 months  . Drug Use: No  . Sexual Activity: Yes     Comment: nuva ring for birth control   Other Topics Concern  . Not on file   Social History Narrative   Health Maintenance  Topic Date Due  . HIV Screening  08/18/1986  . INFLUENZA VACCINE  09/04/2015  . PAP SMEAR  12/01/2016  . TETANUS/TDAP  06/01/2021    The following portions of the patient's history were reviewed and updated as appropriate: allergies, current medications, past family history, past medical history, past social history, past surgical history and problem list.  Review of Systems A comprehensive review of systems was negative.   Objective:    BP 113/63 mmHg  Pulse 69  Wt 247 lb (112.038 kg)  SpO2 99% General appearance: alert, cooperative and appears stated age Head: Normocephalic, without obvious abnormality, atraumatic Eyes: conj clear, EOMI, pEERLA Ears: normal TM's and external ear canals both ears Nose: Nares normal. Septum midline. Mucosa normal. No drainage or sinus tenderness. Throat: lips, mucosa, and tongue normal; teeth and gums normal Neck: no adenopathy, no carotid bruit, no JVD, supple, symmetrical, trachea midline and thyroid not enlarged, symmetric, no tenderness/mass/nodules Back: symmetric, no curvature. ROM normal. No CVA tenderness. Lungs: clear to  auscultation bilaterally Heart: regular rate and rhythm, S1, S2 normal, no murmur, click, rub or gallop Abdomen: soft, non-tender; bowel sounds normal; no masses,  no organomegaly Extremities: extremities normal, atraumatic, no cyanosis or edema Pulses: 2+ and symmetric Skin: Skin color, texture, turgor normal. No rashes or lesions Lymph nodes: Cervical, supraclavicular, and axillary nodes normal. Neurologic: Alert and oriented X 3, normal strength and tone. Normal symmetric reflexes. Normal coordination and gait    Assessment:    Healthy female exam.      Plan:     See After Visit Summary for Counseling Recommendations  Keep up a regular exercise program and make sure you are eating a healthy diet Try to eat 4 servings of dairy a day, or if you are lactose intolerant take a calcium with vitamin D daily.  Your vaccines are up to date.  Cal to get last mammo and pap report.

## 2015-07-13 NOTE — Patient Instructions (Signed)
Keep up a regular exercise program and make sure you are eating a healthy diet Try to eat 4 servings of dairy a day, or if you are lactose intolerant take a calcium with vitamin D daily.  Your vaccines are up to date.   

## 2015-07-14 ENCOUNTER — Other Ambulatory Visit: Payer: Self-pay | Admitting: Family Medicine

## 2015-07-14 LAB — HIV ANTIBODY (ROUTINE TESTING W REFLEX): HIV 1&2 Ab, 4th Generation: NONREACTIVE

## 2015-07-14 LAB — HEMOGLOBIN A1C
HEMOGLOBIN A1C: 5.6 % (ref <5.7)
Mean Plasma Glucose: 114 mg/dL

## 2015-09-28 DIAGNOSIS — K912 Postsurgical malabsorption, not elsewhere classified: Secondary | ICD-10-CM | POA: Insufficient documentation

## 2016-03-25 ENCOUNTER — Ambulatory Visit (INDEPENDENT_AMBULATORY_CARE_PROVIDER_SITE_OTHER): Payer: 59 | Admitting: Family Medicine

## 2016-03-25 ENCOUNTER — Encounter: Payer: Self-pay | Admitting: Family Medicine

## 2016-03-25 VITALS — BP 111/70 | HR 66 | Ht 64.0 in | Wt 163.0 lb

## 2016-03-25 DIAGNOSIS — R21 Rash and other nonspecific skin eruption: Secondary | ICD-10-CM

## 2016-03-25 MED ORDER — CLOBETASOL PROPIONATE 0.05 % EX CREA: 1.0000 "application " | TOPICAL_CREAM | Freq: Two times a day (BID) | CUTANEOUS | 0 refills | Status: DC

## 2016-03-25 NOTE — Addendum Note (Signed)
Addended by: Deno EtienneBARKLEY, Holliday Sheaffer L on: 03/25/2016 04:31 PM   Modules accepted: Orders

## 2016-03-25 NOTE — Patient Instructions (Signed)
Get the Lamisil cream for atheles foot to put on the other elbow.

## 2016-03-25 NOTE — Progress Notes (Signed)
   Subjective:    Patient ID: Colleen [name], female    DOB: May 02, 1970, 46 y.o.   MRN: [id number]  HPI Patient comes in today complaining of rash on both elbows as been present for about a week and half. She describes it as itchy. Not that painful.   Using over-the-counter cortisone cream and trying to moisturize it really well with one of her daughters moisturizers. Her daughter actually has eczema. She herself has never been diagnosed nor has had lesion similar to eczema..    Review of Systems     Objective:   Physical Exam  Constitutional: She is oriented to person, place, and time. She appears well-developed and well-nourished.  HENT:  Head: Normocephalic and atraumatic.  Eyes: Conjunctivae and EOM are normal.  Cardiovascular: Normal rate.   Pulmonary/Chest: Effort normal.  Neurological: She is alert and oriented to person, place, and time.  Skin: Skin is dry. No pallor.  She has an erythematous raised scaly rash on both elbows. It seems to have a circular appearance with some central clearing that there are some excoriations and scabbing over the rash as well.  Psychiatric: She has a normal mood and affect. Her behavior is normal.  Vitals reviewed.         Assessment & Plan:  Rash on both elbows-visually it actually looks like ringworm and it has not responded to over-the-counter hydrocortisone or moisturizers. Did do a KOH scraping today on Right elbow. Will call with results once available. But in the meantime I'm going to treat her with a topical steroid if little stronger, clobetasol on one of those and treat the other elbow with topical Lamisil.

## 2016-03-27 LAB — FUNGAL STAIN

## 2016-03-28 ENCOUNTER — Telehealth: Payer: Self-pay

## 2016-03-28 NOTE — Telephone Encounter (Signed)
Results note closed. Colleen [name] return Colleen [name] call. She states the left (anti fungus) arm is not doing as well as the right (steroid cream). Both arms still have red bumps and itches. She is concerned that it is not completely better. Please advise.     Fungal stain  Order: [id number]  Status:  Final result Visible to patient:  Yes (MyChart) Next appt:  None Dx:  Rash and nonspecific skin eruption  Notes Recorded by Deno Etienneonya L [name], CMA on 03/28/2016 at 10:42 AM EST lvm w/results asked that she call back to let us know how she is doing.Heath GoldBarkley, Colleen Lynetta  ------  Notes Recorded by Agapito Gamesatherine D Metheney, MD on 03/28/2016 at 8:54 AM EST Call patient: Let her know that the skin scraping came back negative for any type of fungus. Please see how the rash is doing. She was using antifungal on one elbow and a steroid only other elbow.   3d ago

## 2016-03-28 NOTE — Telephone Encounter (Signed)
That is okay. Just continue with current treatment through the weekend and on Monday and get an up-to-date. If the side with the steroid cream is looking much better they can start applying it to both sides.

## 2016-03-30 NOTE — Telephone Encounter (Signed)
Patient advised of recommendations.  

## 2016-05-21 ENCOUNTER — Telehealth: Payer: Self-pay

## 2016-05-21 MED ORDER — CLOBETASOL PROPIONATE 0.05 % EX CREA: 1.0000 "application " | TOPICAL_CREAM | Freq: Two times a day (BID) | CUTANEOUS | 1 refills | Status: DC

## 2016-05-21 NOTE — Telephone Encounter (Signed)
Pt's rash is back on her elbows.  Please advise

## 2016-05-21 NOTE — Telephone Encounter (Signed)
Pt advised. Will send refill.

## 2016-05-21 NOTE — Telephone Encounter (Signed)
Ok to restart the steroid cream.

## 2016-07-14 ENCOUNTER — Ambulatory Visit (INDEPENDENT_AMBULATORY_CARE_PROVIDER_SITE_OTHER): Payer: 59 | Admitting: Family Medicine

## 2016-07-14 ENCOUNTER — Encounter: Payer: Self-pay | Admitting: Family Medicine

## 2016-07-14 VITALS — BP 130/78 | HR 77 | Ht 63.78 in | Wt 159.0 lb

## 2016-07-14 DIAGNOSIS — R21 Rash and other nonspecific skin eruption: Secondary | ICD-10-CM

## 2016-07-14 DIAGNOSIS — M67441 Ganglion, right hand: Secondary | ICD-10-CM | POA: Diagnosis not present

## 2016-07-14 DIAGNOSIS — R768 Other specified abnormal immunological findings in serum: Secondary | ICD-10-CM | POA: Diagnosis not present

## 2016-07-14 DIAGNOSIS — R7689 Other specified abnormal immunological findings in serum: Secondary | ICD-10-CM

## 2016-07-14 MED ORDER — LEVOCETIRIZINE DIHYDROCHLORIDE 5 MG PO TABS: 5.0000 mg | ORAL_TABLET | Freq: Every evening | ORAL | 1 refills | Status: DC

## 2016-07-14 NOTE — Progress Notes (Signed)
Subjective:    Patient ID: Colleen [name], female    DOB: [birth year], 46 y.o.   MRN: [id number]  HPI 46 year old female comes in today complaining of a cyst on her First finger of her right hand on the thumb side. She says that she noticed it last Thursday. It was tender until Saturday in the last couple days infection felt a little bit better. She thought maybe it has splinter in it so she tried squeezing it. It has no drainage or discharge. She did remember having trauma to that finger about a month ago. She was trying to move a couch and her husband and child dropped into the couch on her finger.  Also noticed a rash on her right side this morning.    She says she noticed a little itching this morning but no discomfort or pain. She says in fact no longer even bothering her. She denies any new soaps, lotion, perfume, detergents etc. It's on the right flank area.  She reports that she donated blood to the [facility] and was told that she was positive for hepatitis B.   Review of Systems  BP 130/78   Pulse 77   Ht 5' 3.78" (1.62 m)   Wt 159 lb (72.1 kg)   SpO2 100%   BMI 27.48 kg/m     Allergies  Allergen Reactions  . Ace Inhibitors     REACTION: cough    Past Medical History:  Diagnosis Date  . Cough   . Fatty liver disease, nonalcoholic   . Hypertension   . Obesity   . Obesity     Past Surgical History:  Procedure Laterality Date  . PILONIDAL CYST EXCISION      Social History   Social History  . Marital status: Married    Spouse name: N/A  . Number of children: 2  . Years of education: N/A   Occupational History  . Social Worker [employer]   Social History Main Topics  . Smoking status: Never Smoker  . Smokeless tobacco: Never Used  . Alcohol use Yes     Comment: once every 1-2 months  . Drug use: No  . Sexual activity: Yes     Comment: nuva ring for birth control   Other Topics Concern  . Not on file   Social History Narrative  . No  narrative on file    Family History  Problem Relation Age of Onset  . Hypertension Mother   . Diabetes Mother   . Thyroid disease Mother   . Cancer Father   . Asthma Mother   . Asthma Sister     Outpatient Encounter Prescriptions as of 07/14/2016  Medication Sig  . levocetirizine (XYZAL) 5 MG tablet Take 1 tablet (5 mg total) by mouth every evening.  Marland Kitchen. levonorgestrel (MIRENA) 20 MCG/24HR IUD 1 each by Intrauterine route once.  . [DISCONTINUED] levocetirizine (XYZAL) 5 MG tablet Take 1 tablet (5 mg total) by mouth every evening.  . [DISCONTINUED] clobetasol cream (TEMOVATE) 0.05 % Apply 1 application topically 2 (two) times daily.   No facility-administered encounter medications on file as of 07/14/2016.          Objective:   Physical Exam  Constitutional: She is oriented to person, place, and time. She appears well-developed and well-nourished.  HENT:  Head: Normocephalic and atraumatic.  Eyes: Conjunctivae and EOM are normal.  Cardiovascular: Normal rate.   Pulmonary/Chest: Effort normal.  Musculoskeletal:  On the first finger near  the PIP of her right hand she has a firm nodule that's on the thumb side. It's nontender on exam. It looks like there is a little bit of blood underneath the skin. Possibly where she squeezed it. Normal flexion extension of the hand and finger.  Neurological: She is alert and oriented to person, place, and time.  Skin: Skin is dry. No pallor.  On her right side she has an erythematous papular rash. No distinct vesicles.  Psychiatric: She has a normal mood and affect. Her behavior is normal.  Vitals reviewed.         Assessment & Plan:  Cyst on finger-most consistent with a synovial cyst versus mucinous cyst.. It does seem like it's no longer tender and may be getting a little better so if not improving by the end of the week and recommend x-ray for further evaluation. We'll go ahead and place order and she can go at her  convenience.  Hepatitis B antibody positive-we'll do confirmatory testing. Lab slip provided today.  Rash-unclear etiology. Did encourage her to keep an eye out for any new vesicles or if the rash is spreading.

## 2016-07-16 LAB — HEPATITIS B CORE ANTIBODY, IGM: Hep B C IgM: NONREACTIVE

## 2016-07-16 LAB — HEPATITIS B SURFACE ANTIGEN: HEP B S AG: NEGATIVE

## 2016-07-16 LAB — HEPATITIS B SURFACE ANTIBODY,QUALITATIVE: HEP B S AB: NEGATIVE

## 2016-07-17 LAB — HEPATITIS B DNA, ULTRAQUANTITATIVE, PCR
Hepatitis B DNA (Calc): <1 Log IU/mL
Hepatitis B DNA: <10 IU/mL

## 2016-07-21 ENCOUNTER — Encounter: Payer: Self-pay | Admitting: Family Medicine

## 2016-07-21 ENCOUNTER — Ambulatory Visit (INDEPENDENT_AMBULATORY_CARE_PROVIDER_SITE_OTHER): Payer: 59

## 2016-07-21 DIAGNOSIS — W230XXD Caught, crushed, jammed, or pinched between moving objects, subsequent encounter: Secondary | ICD-10-CM

## 2016-07-21 DIAGNOSIS — S62630D Displaced fracture of distal phalanx of right index finger, subsequent encounter for fracture with routine healing: Secondary | ICD-10-CM | POA: Diagnosis not present

## 2016-07-21 DIAGNOSIS — M67441 Ganglion, right hand: Secondary | ICD-10-CM

## 2016-07-31 DIAGNOSIS — E611 Iron deficiency: Secondary | ICD-10-CM | POA: Insufficient documentation

## 2016-08-07 ENCOUNTER — Ambulatory Visit (INDEPENDENT_AMBULATORY_CARE_PROVIDER_SITE_OTHER): Payer: 59 | Admitting: Family Medicine

## 2016-08-07 VITALS — BP 118/75 | HR 72 | Temp 97.8°F | Wt 157.0 lb

## 2016-08-07 DIAGNOSIS — L84 Corns and callosities: Secondary | ICD-10-CM | POA: Diagnosis not present

## 2016-08-07 NOTE — Progress Notes (Signed)
   Subjective:    I'm seeing this patient as a consultation for:  Colleen Colleen [name], Colleen D, MD   CC: Right Finger Mass  HPI: Colleen Colleen [name] a small mass or bump on her right index finger at the radial aspect of the PIP joint. She was seen by her PCP on June 11 where she had an x-ray that showed a possible old avulsion fracture at the area. She notes it's not particularly painful. She denies any pain or motion limitations. She notes normal strength and feels well otherwise. She does finds the bump to be a bit unsightly.  Past medical history, Surgical history, Family history not pertinant except as noted below, Social history, Allergies, and medications have been entered into the medical record, reviewed, and no changes needed.   Review of Systems: No headache, visual changes, nausea, vomiting, diarrhea, constipation, dizziness, abdominal pain, skin rash, fevers, chills, night sweats, weight loss, swollen lymph nodes, body aches, joint swelling, muscle aches, chest pain, shortness of breath, mood changes, visual or auditory hallucinations.   Objective:    Vitals:   08/07/16 0921  BP: 118/75  Pulse: 72  Temp: 97.8 F (36.6 C)   General: Well Developed, well nourished, and in no acute distress.  Neuro/Psych: Alert and oriented x3, extra-ocular muscles intact, able to move all 4 extremities, sensation grossly intact. Skin: Warm and dry, no rashes noted.  Respiratory: Not using accessory muscles, speaking in full sentences, trachea midline.  Cardiovascular: Pulses palpable, no extremity edema. Abdomen: Does not appear distended. MSK:  Right index finger small slightly raised 2 mm mass with some excoriation at the superficial fascial aspect. Nontender with normal motion.  Limited musculoskeletal ultrasound of the right index finger PIP reveals fossae and of the distal end of the proximal phalanx directly underneath the mass. There is no cystic lesion visible. No obvious fractures. Normal bony  structures.  Study Result   CLINICAL DATA:  Ganglion cyst right index finger.  EXAM: RIGHT INDEX FINGER 2+V  COMPARISON:  08/02/2014  FINDINGS: Previously identified fracture of the distal tuft of the right index finger Colleen [name] healed. Tiny avulsion fracture of the base of the palmar aspect of the distal phalanx of the right second digit cannot be exclude. No soft tissue abnormalities identified  IMPRESSION: Tiny avulsion fracture of the base of the palmar aspect of the distal phalanx of the right second digit cannot be excluded. Previously identified fracture of the distal tuft of the right index finger on study of 08/02/2014 Colleen [name] healed.   Electronically Signed   By: Maisie Fushomas  Register   On: 07/21/2016 09:25     No results found for this or any previous visit (from the past 24 hour(s)). No results found.  Impression and Recommendations:    Assessment and Plan: 46 y.o. female with Finger mass. This is not a ganglion cyst is no cystic structure was seen on ultrasound. I believe she is developing a callus overlying bossing of the proximal phalanx. I also don't think there is any significant fracture. Plan for reassurance and watchful waiting. Return as needed.   No orders of the defined types were placed in this encounter.  No orders of the defined types were placed in this encounter.   Discussed warning signs or symptoms. Please see discharge instructions. Patient expresses understanding.

## 2016-08-07 NOTE — Patient Instructions (Signed)
Thank you for coming in today. We will keep an eye on this.  If it worsens or becomes bothersome let me know.

## 2016-09-29 ENCOUNTER — Emergency Department
Admission: EM | Admit: 2016-09-29 | Discharge: 2016-09-29 | Disposition: A | Discharge status: Home / Self Care | Payer: 59 | Source: Home / Self Care | Attending: Family Medicine | Admitting: Family Medicine

## 2016-09-29 DIAGNOSIS — S61211A Laceration without foreign body of left index finger without damage to nail, initial encounter: Secondary | ICD-10-CM | POA: Diagnosis not present

## 2016-09-29 NOTE — ED Triage Notes (Signed)
Pt was cutting a cantaloupe about 45 minutes ago, and cut left pointer finger.

## 2016-09-29 NOTE — ED Provider Notes (Signed)
[facility]    CSN: [id number] Arrival date & time: 09/29/16  1922     History   Chief Complaint Chief Complaint  Patient presents with  . Laceration    HPI Colleen [name] is a 46 y.o. female.   Patient cut her left index fingertip with a paring knife 45 minutes ago.  Her last Tdap was 06/02/11.   The history is provided by the patient.  Laceration  Location:  Finger Finger laceration location:  L index finger Length:  1cm Depth:  Through dermis Quality: straight   Bleeding: controlled with pressure   Time since incident:  45 minutes Laceration mechanism:  Knife Pain details:    Quality:  Aching   Severity:  Mild   Timing:  Constant   Progression:  Unchanged Foreign body present:  No foreign bodies Worsened by:  Movement Ineffective treatments:  None tried Tetanus status:  Up to date Associated symptoms: no numbness and no swelling     Past Medical History:  Diagnosis Date  . Cough   . Fatty liver disease, nonalcoholic   . Hypertension   . Obesity   . Obesity     Patient Active Problem List   Diagnosis Date Noted  . Closed fracture of distal phalanx of second finger of right hand 07/12/2014  . Cough 08/13/2012  . Allergic rhinitis, cause unspecified 05/30/2010  . Hyperlipemia 05/30/2010  . DERMATITIS, ATOPIC 03/11/2010  . OBESITY, UNSPECIFIED 08/02/2008  . ADJUSTMENT DISORDER WITH ANXIETY 08/01/2008  . FATTY LIVER DISEASE 08/01/2008  . HYPERTENSION, BENIGN ESSENTIAL 12/02/2006    Past Surgical History:  Procedure Laterality Date  . PILONIDAL CYST EXCISION      OB History    No data available       Home Medications    Prior to Admission medications   Medication Sig Start Date End Date Taking? Authorizing Provider  levocetirizine (XYZAL) 5 MG tablet Take 1 tablet (5 mg total) by mouth every evening. 07/14/16   Agapito Games, MD  levonorgestrel (MIRENA) 20 MCG/24HR IUD 1 each by Intrauterine route once.     [provider]    Family History Family History  Problem Relation Age of Onset  . Hypertension Mother   . Diabetes Mother   . Thyroid disease Mother   . Asthma Mother   . Cancer Father   . Asthma Sister     Social History Social History  Substance Use Topics  . Smoking status: Never Smoker  . Smokeless tobacco: Never Used  . Alcohol use Yes     Comment: once every 1-2 months     Allergies   Ace inhibitors   Review of Systems Review of Systems  All other systems reviewed and are negative.    Physical Exam Triage Vital Signs ED Triage Vitals  Enc Vitals Group     BP 09/29/16 1935 138/89     Pulse Rate 09/29/16 1935 74     Resp --      Temp 09/29/16 1935 98.5 F (36.9 C)     Temp Source 09/29/16 1935 Oral     SpO2 09/29/16 1935 100 %     Weight --      Height --      Head Circumference --      Peak Flow --      Pain Score 09/29/16 1936 5     Pain Loc --      Pain Edu? --      Excl.  in GC? --    No data found.   Updated Vital Signs BP 138/89 (BP Location: Left Arm)   Pulse 74   Temp 98.5 F (36.9 C) (Oral)   SpO2 100%   Visual Acuity Right Eye Distance:   Left Eye Distance:   Bilateral Distance:    Right Eye Near:   Left Eye Near:    Bilateral Near:     Physical Exam  Constitutional: She appears well-developed and well-nourished. No distress.  HENT:  Head: Normocephalic.  Eyes: Pupils are equal, round, and reactive to light. Conjunctivae are normal.  Cardiovascular: Normal rate.   Pulmonary/Chest: Effort normal.  Musculoskeletal:       Right shoulder: She exhibits decreased range of motion, tenderness and laceration.       Hands: Left second fingertip has a 1cm simple linear laceration as noted on diagram.  Fingertip has full range of motion.  Neurological: She is alert.  Skin: Skin is warm and dry.  Nursing note and vitals reviewed.    UC Treatments / Results  Labs (all labs ordered are listed, but only abnormal  results are displayed) Labs Reviewed - No data to display  EKG  EKG Interpretation None       Radiology No results found.  Procedures Procedures Laceration Repair (Dermabond) Discussed benefits and risks of procedure and verbal consent obtained. Using sterile technique, cleansed wound with Betadine followed by copious lavage with normal saline.  Wound carefully inspected for debris and foreign bodies; none found.  Applied Celox hemostatic agent to stop bleeding. Wound edges carefully approximated in normal anatomic position and closed with Dermabond.  Wound precautions explained to patient.     Medications Ordered in UC Medications - No data to display   Initial Impression / Assessment and Plan / UC Course  I have reviewed the triage vital signs and the nursing notes.  Pertinent labs & imaging results that were available during my care of the patient were reviewed by me and considered in my medical decision making (see chart for details).    Dispensed a stack splint. Keep wound clean and dry.  Return for any signs of infection (or follow-up with family doctor):  Increasing redness, swelling, pain, heat, drainage, etc. Follow instructions on Dermabond information sheet.     Final Clinical Impressions(s) / UC Diagnoses   Final diagnoses:  Laceration of left index finger without foreign body without damage to nail, initial encounter    New Prescriptions New Prescriptions   No medications on file        Lattie Haw, MD 09/30/16 1322

## 2016-09-29 NOTE — Discharge Instructions (Signed)
Keep wound clean and dry.  Return for any signs of infection (or follow-up with family doctor):  Increasing redness, swelling, pain, heat, drainage, etc. °Follow instructions on Dermabond information sheet.  °

## 2017-04-08 LAB — HM PAP SMEAR: HM Pap smear: NORMAL

## 2017-04-13 LAB — HM MAMMOGRAPHY

## 2017-04-21 ENCOUNTER — Other Ambulatory Visit: Payer: Self-pay | Admitting: Family Medicine

## 2017-08-11 ENCOUNTER — Other Ambulatory Visit: Payer: Self-pay

## 2017-08-11 ENCOUNTER — Emergency Department
Admission: EM | Admit: 2017-08-11 | Discharge: 2017-08-11 | Disposition: A | Discharge status: Home / Self Care | Payer: 59 | Source: Home / Self Care | Attending: Family Medicine | Admitting: Family Medicine

## 2017-08-11 ENCOUNTER — Encounter: Payer: Self-pay | Admitting: Emergency Medicine

## 2017-08-11 DIAGNOSIS — B9789 Other viral agents as the cause of diseases classified elsewhere: Secondary | ICD-10-CM

## 2017-08-11 DIAGNOSIS — J069 Acute upper respiratory infection, unspecified: Secondary | ICD-10-CM | POA: Diagnosis not present

## 2017-08-11 MED ORDER — BENZONATATE 100 MG PO CAPS: 100.0000 mg | ORAL_CAPSULE | Freq: Three times a day (TID) | ORAL | 0 refills | Status: DC

## 2017-08-11 NOTE — ED Provider Notes (Signed)
[facility]    CSN: [id number] Arrival date & time: 08/11/17  0844     History   Chief Complaint Chief Complaint  Patient presents with  . Cough    HPI Colleen [name] is a 47 y.o. female.   HPI Colleen [name] is a 47 y.o. female presenting to UC with c/o dry cough for about 1 week. She initially thought it was due to allergies but has been taking her allergy medication w/o relief.  Mild sore throat and minimal nasal congestion.  Denies sinus pain or ear pain. Denies fever, chills, n/v/d. Denies SOB or chest pain. Husband and daughter are also sick. Husband was dx with a viral illness yesterday and daughter dx with a sinus infection.    Past Medical History:  Diagnosis Date  . Cough   . Fatty liver disease, nonalcoholic   . Hypertension   . Obesity   . Obesity     Patient Active Problem List   Diagnosis Date Noted  . Closed fracture of distal phalanx of second finger of right hand 07/12/2014  . Cough 08/13/2012  . Allergic rhinitis, cause unspecified 05/30/2010  . Hyperlipemia 05/30/2010  . DERMATITIS, ATOPIC 03/11/2010  . OBESITY, UNSPECIFIED 08/02/2008  . ADJUSTMENT DISORDER WITH ANXIETY 08/01/2008  . FATTY LIVER DISEASE 08/01/2008  . HYPERTENSION, BENIGN ESSENTIAL 12/02/2006    Past Surgical History:  Procedure Laterality Date  . PILONIDAL CYST EXCISION      OB History   None      Home Medications    Prior to Admission medications   Medication Sig Start Date End Date Taking? Authorizing Provider  benzonatate (TESSALON) 100 MG capsule Take 1-2 capsules (100-200 mg total) by mouth every 8 (eight) hours. 08/11/17   Lurene ShadowPhelps, Aryssa Rosamond O, PA-C  levocetirizine (XYZAL) 5 MG tablet Take 1 tablet (5 mg total) by mouth every evening. 07/14/16   [name], [name], MD  levonorgestrel (MIRENA) 20 MCG/24HR IUD 1 each by Intrauterine route once.    [provider]    Family History Family History  Problem Relation Age of Onset  .  Hypertension Mother   . Diabetes Mother   . Thyroid disease Mother   . Asthma Mother   . Cancer Father   . Asthma Sister     Social History Social History   Tobacco Use  . Smoking status: Never Smoker  . Smokeless tobacco: Never Used  Substance Use Topics  . Alcohol use: Yes    Comment: once every 1-2 months  . Drug use: No     Allergies   Ace inhibitors   Review of Systems Review of Systems  Constitutional: Negative for chills and fever.  HENT: Positive for congestion and sore throat ( minimal). Negative for ear pain, trouble swallowing and voice change.   Respiratory: Positive for cough. Negative for shortness of breath.   Cardiovascular: Negative for chest pain and palpitations.  Gastrointestinal: Negative for abdominal pain, diarrhea, nausea and vomiting.  Skin: Negative for rash.     Physical Exam Triage Vital Signs ED Triage Vitals  Enc Vitals Group     BP 08/11/17 0911 119/82     Pulse Rate 08/11/17 0911 69     Resp --      Temp 08/11/17 0911 97.6 F (36.4 C)     Temp Source 08/11/17 0911 Oral     SpO2 08/11/17 0911 100 %     Weight 08/11/17 0912 166 lb (75.3 kg)     Height  08/11/17 0912 5\' 4"  (1.626 m)     Head Circumference --      Peak Flow --      Pain Score 08/11/17 0911 0     Pain Loc --      Pain Edu? --      Excl. in GC? --    No data found.  Updated Vital Signs BP 119/82 (BP Location: Right Arm)   Pulse 69   Temp 97.6 F (36.4 C) (Oral)   Ht 5\' 4"  (1.626 m)   Wt 166 lb (75.3 kg)   SpO2 100%   BMI 28.49 kg/m   Visual Acuity Right Eye Distance:   Left Eye Distance:   Bilateral Distance:    Right Eye Near:   Left Eye Near:    Bilateral Near:     Physical Exam  Constitutional: She is oriented to person, place, and time. She appears well-developed and well-nourished. No distress.  HENT:  Head: Normocephalic and atraumatic.  Right Ear: Tympanic membrane normal.  Left Ear: Tympanic membrane normal.  Nose: Nose normal.    Mouth/Throat: Uvula is midline, oropharynx is clear and moist and mucous membranes are normal.  Eyes: EOM are normal.  Neck: Normal range of motion. Neck supple.  Cardiovascular: Normal rate and regular rhythm.  Pulmonary/Chest: Effort normal and breath sounds normal. No stridor. No respiratory distress. She has no wheezes. She has no rales.  Musculoskeletal: Normal range of motion.  Neurological: She is alert and oriented to person, place, and time.  Skin: Skin is warm and dry. She is not diaphoretic.  Psychiatric: She has a normal mood and affect. Her behavior is normal.  Nursing note and vitals reviewed.    UC Treatments / Results  Labs (all labs ordered are listed, but only abnormal results are displayed) Labs Reviewed - No data to display  EKG None  Radiology No results found.  Procedures Procedures (including critical care time)  Medications Ordered in UC Medications - No data to display  Initial Impression / Assessment and Plan / UC Course  I have reviewed the triage vital signs and the nursing notes.  Pertinent labs & imaging results that were available during my care of the patient were reviewed by me and considered in my medical decision making (see chart for details).     Hx and exam c/w viral illness No evidence of underlying bacterial infection. Home care instructions provided.  Final Clinical Impressions(s) / UC Diagnoses   Final diagnoses:  Viral URI with cough     Discharge Instructions      Please follow up with family medicine in 7-10 days if symptoms not improving.     ED Prescriptions    Medication Sig Dispense Auth. Provider   benzonatate (TESSALON) 100 MG capsule Take 1-2 capsules (100-200 mg total) by mouth every 8 (eight) hours. 21 capsule Lurene Shadow, PA-C     Controlled Substance Prescriptions [place] Controlled Substance Registry consulted? Not Applicable   Rolla Plate 08/11/17 1610

## 2017-08-11 NOTE — Discharge Instructions (Signed)
°  Please follow up with family medicine in 7-10 days if symptoms not improving.

## 2017-08-11 NOTE — ED Triage Notes (Signed)
Productive cough in the mornings x 1 week, dry in the afternoon and night. Husband and daughter are both sick.

## 2017-08-14 ENCOUNTER — Ambulatory Visit: Payer: 59 | Admitting: Family Medicine

## 2017-08-14 ENCOUNTER — Encounter: Payer: Self-pay | Admitting: Family Medicine

## 2017-08-14 VITALS — BP 115/79 | HR 71 | Temp 98.4°F | Wt 162.0 lb

## 2017-08-14 DIAGNOSIS — J329 Chronic sinusitis, unspecified: Secondary | ICD-10-CM

## 2017-08-14 MED ORDER — GUAIFENESIN-CODEINE 100-10 MG/5ML PO SOLN: 5.0000 mL | Freq: Four times a day (QID) | ORAL | 0 refills | Status: DC | PRN

## 2017-08-14 MED ORDER — IPRATROPIUM BROMIDE 0.06 % NA SOLN: 2.0000 | NASAL | 6 refills | Status: DC | PRN

## 2017-08-14 MED ORDER — AZITHROMYCIN 250 MG PO TABS: 250.0000 mg | ORAL_TABLET | Freq: Every day | ORAL | 0 refills | Status: DC

## 2017-08-14 MED ORDER — PREDNISONE 10 MG PO TABS: 30.0000 mg | ORAL_TABLET | Freq: Every day | ORAL | 0 refills | Status: DC

## 2017-08-14 NOTE — Patient Instructions (Signed)
.Thank you for coming in today. Use the atrovent nasal spray Use flonase nasal spray as well.  Continue allergy medicine.   Use codeine cough medicine as needed.  Do not drive after taking this.   If not better fill and take azithromycin antibiotic.   If still not better fill and take the prednisone.    Acute Bronchitis, Adult Acute bronchitis is sudden (acute) swelling of the air tubes (bronchi) in the lungs. Acute bronchitis causes these tubes to fill with mucus, which can make it hard to breathe. It can also cause coughing or wheezing. In adults, acute bronchitis usually goes away within 2 weeks. A cough caused by bronchitis may last up to 3 weeks. Smoking, allergies, and asthma can make the condition worse. Repeated episodes of bronchitis may cause further lung problems, such as chronic obstructive pulmonary disease (COPD). What are the causes? This condition can be caused by germs and by substances that irritate the lungs, including:  Cold and flu viruses. This condition is most often caused by the same virus that causes a cold.  Bacteria.  Exposure to tobacco smoke, dust, fumes, and air pollution.  What increases the risk? This condition is more likely to develop in people who:  Have close contact with someone with acute bronchitis.  Are exposed to lung irritants, such as tobacco smoke, dust, fumes, and vapors.  Have a weak immune system.  Have a respiratory condition such as asthma.  What are the signs or symptoms? Symptoms of this condition include:  A cough.  Coughing up clear, yellow, or green mucus.  Wheezing.  Chest congestion.  Shortness of breath.  A fever.  Body aches.  Chills.  A sore throat.  How is this diagnosed? This condition is usually diagnosed with a physical exam. During the exam, your health care provider may order tests, such as chest X-rays, to rule out other conditions. He or she may also:  Test a sample of your mucus for  bacterial infection.  Check the level of oxygen in your blood. This is done to check for pneumonia.  Do a chest X-ray or lung function testing to rule out pneumonia and other conditions.  Perform blood tests.  Your health care provider will also ask about your symptoms and medical history. How is this treated? Most cases of acute bronchitis clear up over time without treatment. Your health care provider may recommend:  Drinking more fluids. Drinking more makes your mucus thinner, which may make it easier to breathe.  Taking a medicine for a fever or cough.  Taking an antibiotic medicine.  Using an inhaler to help improve shortness of breath and to control a cough.  Using a cool mist vaporizer or humidifier to make it easier to breathe.  Follow these instructions at home: Medicines  Take over-the-counter and prescription medicines only as told by your health care provider.  If you were prescribed an antibiotic, take it as told by your health care provider. Do not stop taking the antibiotic even if you start to feel better. General instructions  Get plenty of rest.  Drink enough fluids to keep your urine clear or pale yellow.  Avoid smoking and secondhand smoke. Exposure to cigarette smoke or irritating chemicals will make bronchitis worse. If you smoke and you need help quitting, ask your health care provider. Quitting smoking will help your lungs heal faster.  Use an inhaler, cool mist vaporizer, or humidifier as told by your health care provider.  Keep all follow-up visits  as told by your health care provider. This is important. How is this prevented? To lower your risk of getting this condition again:  Wash your hands often with soap and water. If soap and water are not available, use hand sanitizer.  Avoid contact with people who have cold symptoms.  Try not to touch your hands to your mouth, nose, or eyes.  Make sure to get the flu shot every year.  Contact a  health care provider if:  Your symptoms do not improve in 2 weeks of treatment. Get help right away if:  You cough up blood.  You have chest pain.  You have severe shortness of breath.  You become dehydrated.  You faint or keep feeling like you are going to faint.  You keep vomiting.  You have a severe headache.  Your fever or chills gets worse. This information is not intended to replace advice given to you by your health care provider. Make sure you discuss any questions you have with your health care provider. Document Released: 02/28/2004 Document Revised: 08/15/2015 Document Reviewed: 07/11/2015 Elsevier Interactive Patient Education  [employer].

## 2017-08-14 NOTE — Progress Notes (Signed)
Colleen [name] is a 47 y.o. female who presents to [facility]: Primary Care Sports Medicine today for cough.  Patient notes a 2-week history of cough.  She notes mild fevers and chills but denies significant wheezing or shortness of breath.  She notes the cough is occasionally productive.  She is tried some Claritin which did not help much.  Additionally she was seen in urgent care where she was prescribed Tessalon Perles which have not helped much at all.  She has not used nasal spray yet but does note some postnasal drainage symptoms that she thinks is probably causing the cough.  She denies significant acid reflux.  She feels well otherwise.  No chest pain palpitations.  Health maintenance: Patient notes that she is has [facility] the last year or 2.. ROS as above:  Exam:  BP 115/79   Pulse 71   Temp 98.4 F (36.9 C) (Oral)   Wt 162 lb (73.5 kg)   SpO2 99%   BMI 27.81 kg/m  Gen: Well NAD HEENT: EOMI,  MMM some minimal cobblestoning.  No cervical lymphadenopathy.  Normal tympanic membranes.  Nasal turbinates are mildly erythematous with mild clear nasal discharge present. Lungs: Normal work of breathing. CTABL Heart: RRR no MRG Abd: NABS, Soft. Nondistended, Nontender Exts: Brisk capillary refill, warm and well perfused.     Assessment and Plan: 47 y.o. female with  Cough: Postnasal drainage likely due to viral URI or seasonal allergies.  Possible early sinusitis.  Plan to treat with Atrovent nasal spray and codeine cough syrup.  Printed back-up prescription for prednisone and azithromycin provided and will be used if needed if not improving or worsening.  Will also obtain medical records of recent Pap smear   No orders of the defined types were placed in this encounter.  Meds ordered this encounter  Medications  . ipratropium (ATROVENT) 0.06 %  nasal spray    Sig: Place 2 sprays into both nostrils every 4 (four) hours as needed for rhinitis.    Dispense:  10 mL    Refill:  6  . guaiFENesin-codeine 100-10 MG/5ML syrup    Sig: Take 5 mLs by mouth every 6 (six) hours as needed for cough.    Dispense:  120 mL    Refill:  0  . predniSONE (DELTASONE) 10 MG tablet    Sig: Take 3 tablets (30 mg total) by mouth daily with breakfast.    Dispense:  15 tablet    Refill:  0  . azithromycin (ZITHROMAX) 250 MG tablet    Sig: Take 1 tablet (250 mg total) by mouth daily. Take first 2 tablets together, then 1 every day until finished.    Dispense:  6 tablet    Refill:  0     Historical information moved to improve visibility of documentation.  Past Medical History:  Diagnosis Date  . Cough   . Fatty liver disease, nonalcoholic   . Hypertension   . Obesity   . Obesity    Past Surgical History:  Procedure Laterality Date  . PILONIDAL CYST EXCISION     Social History   Tobacco Use  . Smoking status: Never Smoker  . Smokeless tobacco: Never Used  Substance Use Topics  . Alcohol use: Yes    Comment: once every 1-2 months   family history includes Asthma in her mother and sister; Cancer in her father; Diabetes in her mother;  Hypertension in her mother; Thyroid disease in her mother.  Medications: Current Outpatient Medications  Medication Sig Dispense Refill  . benzonatate (TESSALON) 100 MG capsule Take 1-2 capsules (100-200 mg total) by mouth every 8 (eight) hours. 21 capsule 0  . levocetirizine (XYZAL) 5 MG tablet Take 1 tablet (5 mg total) by mouth every evening. 90 tablet 1  . levonorgestrel (MIRENA) 20 MCG/24HR IUD 1 each by Intrauterine route once.    Marland Kitchen. azithromycin (ZITHROMAX) 250 MG tablet Take 1 tablet (250 mg total) by mouth daily. Take first 2 tablets together, then 1 every day until finished. 6 tablet 0  . guaiFENesin-codeine 100-10 MG/5ML syrup Take 5 mLs by mouth every 6 (six) hours as needed for cough. 120 mL 0  .  ipratropium (ATROVENT) 0.06 % nasal spray Place 2 sprays into both nostrils every 4 (four) hours as needed for rhinitis. 10 mL 6  . predniSONE (DELTASONE) 10 MG tablet Take 3 tablets (30 mg total) by mouth daily with breakfast. 15 tablet 0   No current facility-administered medications for this visit.    Allergies  Allergen Reactions  . Ace Inhibitors     REACTION: cough     Discussed warning signs or symptoms. Please see discharge instructions. Patient expresses understanding.

## 2017-08-28 ENCOUNTER — Encounter: Payer: Self-pay | Admitting: Family Medicine

## 2017-10-08 ENCOUNTER — Encounter: Payer: Self-pay | Admitting: Family Medicine

## 2019-12-15 ENCOUNTER — Ambulatory Visit (INDEPENDENT_AMBULATORY_CARE_PROVIDER_SITE_OTHER): Payer: 59 | Admitting: Sports Medicine

## 2019-12-15 DIAGNOSIS — M25512 Pain in left shoulder: Secondary | ICD-10-CM | POA: Diagnosis not present

## 2019-12-15 DIAGNOSIS — G8929 Other chronic pain: Secondary | ICD-10-CM | POA: Diagnosis not present

## 2019-12-15 MED ORDER — MELOXICAM 15 MG PO TABS: ORAL_TABLET | ORAL | 3 refills | Status: DC

## 2019-12-15 NOTE — Assessment & Plan Note (Signed)
For a couple of months now this pleasant 49 year old female has had pain in her left shoulder, worse with internal rotation. She has a fairly benign exam with the exception of some subscapularis dysfunction. It sounds like she had x-rays already, and she had about 2 months of home therapy with a chiropractor. We will continue conservative treatment, but I am going to add meloxicam, aggressive formal physical therapy, return to see me in a month, we will do a subcoracoid space injection if no better.

## 2019-12-15 NOTE — Progress Notes (Signed)
    Procedures performed today:    None.  Independent interpretation of notes and tests performed by another provider:   None.  Brief History, Exam, Impression, and Recommendations:    Chronic left shoulder pain For a couple of months now this pleasant 49 year old female has had pain in her left shoulder, worse with internal rotation. She has a fairly benign exam with the exception of some subscapularis dysfunction. It sounds like she had x-rays already, and she had about 2 months of home therapy with a chiropractor. We will continue conservative treatment, but I am going to add meloxicam, aggressive formal physical therapy, return to see me in a month, we will do a subcoracoid space injection if no better.    ___________________________________________ Ihor Austin. Benjamin Stain, M.D., ABFM., CAQSM. Primary Care and Sports Medicine [facility]  Adjunct Instructor of Family Medicine  [employer]

## 2019-12-22 ENCOUNTER — Ambulatory Visit (INDEPENDENT_AMBULATORY_CARE_PROVIDER_SITE_OTHER): Payer: 59 | Admitting: Physical Therapy

## 2019-12-22 ENCOUNTER — Other Ambulatory Visit: Payer: Self-pay

## 2019-12-22 DIAGNOSIS — M6281 Muscle weakness (generalized): Secondary | ICD-10-CM

## 2019-12-22 DIAGNOSIS — M25512 Pain in left shoulder: Secondary | ICD-10-CM

## 2019-12-22 DIAGNOSIS — G8929 Other chronic pain: Secondary | ICD-10-CM
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
## 2019-12-22 NOTE — Patient Instructions (Signed)
Access Code: [email or web address] URL: [email or web address] Date: 12/22/2019 Prepared by: Raynelle Fanning  Exercises Standing Single Arm Shoulder Flexion with Posterior Anchored Resistance - 1 x daily - 7 x weekly - 3 sets - 10 reps Standing Shoulder Internal Rotation with Anchored Resistance - 1 x daily - 7 x weekly - 3 sets - 10 reps Shoulder External Rotation with Anchored Resistance - 1 x daily - 7 x weekly - 3 sets - 10 reps Single Arm Shoulder Extension with Anchored Resistance - 1 x daily - 7 x weekly - 3 sets - 10 reps  Patient Education Trigger Point Dry Needling

## 2019-12-26 ENCOUNTER — Other Ambulatory Visit: Payer: Self-pay

## 2019-12-26 ENCOUNTER — Encounter: Payer: Self-pay | Admitting: Physical Therapy

## 2019-12-26 ENCOUNTER — Ambulatory Visit (INDEPENDENT_AMBULATORY_CARE_PROVIDER_SITE_OTHER): Payer: 59 | Admitting: Physical Therapy

## 2019-12-26 DIAGNOSIS — M6281 Muscle weakness (generalized): Secondary | ICD-10-CM | POA: Diagnosis not present

## 2019-12-26 DIAGNOSIS — G8929 Other chronic pain: Secondary | ICD-10-CM | POA: Diagnosis not present

## 2019-12-26 DIAGNOSIS — M25512 Pain in left shoulder: Secondary | ICD-10-CM | POA: Diagnosis not present
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
## 2019-12-26 NOTE — Patient Instructions (Signed)
Access Code: [email or web address]: [email or web address]: 11/22/2021Prepared by: [facility] [place]  Standing Single Arm Shoulder Flexion with Posterior Anchored Resistance - 1 x daily - 7 x weekly - 2 sets - 10 reps  Standing Shoulder Internal Rotation with Anchored Resistance - 1 x daily - 7 x weekly - 2 sets - 10 reps  Single Arm Shoulder Extension with Anchored Resistance - 1 x daily - 7 x weekly - 2 sets - 10 reps  Shoulder External Rotation and Scapular Retraction with Resistance - 1 x daily - 7 x weekly - 2 sets - 10 reps  Standing Bilateral Low Shoulder Row with Anchored Resistance - 1 x daily - 7 x weekly - 2 sets - 10 reps  Seated Shoulder W - 2 x daily - 7 x weekly - 1 sets - 5 reps - 5 sec hold  Doorway Pec Stretch at 90 Degrees Abduction - 1-2 x daily - 7 x weekly - 1 sets - 2-3 reps - 15 sec hold  Doorway Pec Stretch at 120 Elevation with Arm Straight - 1-2 x daily - 7 x weekly - 1 sets - 2-3 reps - 15 sec hold

## 2020-01-02 ENCOUNTER — Ambulatory Visit (INDEPENDENT_AMBULATORY_CARE_PROVIDER_SITE_OTHER): Payer: 59 | Admitting: Physical Therapy

## 2020-01-02 ENCOUNTER — Other Ambulatory Visit: Payer: Self-pay

## 2020-01-02 ENCOUNTER — Encounter: Payer: Self-pay | Admitting: Physical Therapy

## 2020-01-02 DIAGNOSIS — M25512 Pain in left shoulder: Secondary | ICD-10-CM | POA: Diagnosis not present

## 2020-01-02 DIAGNOSIS — M6281 Muscle weakness (generalized): Secondary | ICD-10-CM

## 2020-01-02 DIAGNOSIS — G8929 Other chronic pain: Secondary | ICD-10-CM

## 2020-01-02 NOTE — Patient Instructions (Signed)
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
## 2020-01-04 ENCOUNTER — Ambulatory Visit (INDEPENDENT_AMBULATORY_CARE_PROVIDER_SITE_OTHER): Payer: 59 | Admitting: Physical Therapy

## 2020-01-04 ENCOUNTER — Other Ambulatory Visit: Payer: Self-pay

## 2020-01-04 DIAGNOSIS — G8929 Other chronic pain: Secondary | ICD-10-CM | POA: Diagnosis not present

## 2020-01-04 DIAGNOSIS — M25512 Pain in left shoulder: Secondary | ICD-10-CM | POA: Diagnosis not present

## 2020-01-04 DIAGNOSIS — M6281 Muscle weakness (generalized): Secondary | ICD-10-CM | POA: Diagnosis not present
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
## 2020-01-04 NOTE — Patient Instructions (Signed)
Access Code: [email or web address]: [email or web address]: 12/01/2021Prepared by: [facility] [place] [facility] [place]erc[place]  Standing Single Arm Shoulder Flexion with Posterior Anchored Resistance - 1 x daily - 7 x weekly - 2 sets - 10 reps  Shoulder External Rotation and Scapular Retraction with Resistance - 1 x daily - 7 x weekly - 2 sets - 10 reps - 3 hold  Standing Shoulder Single Arm PNF D2 Flexion with Resistance - 1 x daily - 7 x weekly - 3 sets - 10 reps  Standing Bilateral Low Shoulder Row with Anchored Resistance - 1 x daily - 7 x weekly - 2 sets - 10 reps - 3 sec hold  Doorway Pec Stretch at 90 Degrees Abduction - 1-2 x daily - 7 x weekly - 1 sets - 2-3 reps - 15 sec hold  Doorway Pec Stretch at 120 Elevation with Arm Straight - 1-2 x daily - 7 x weekly - 1 sets - 2-3 reps - 15 sec hold  Standing Shoulder Internal Rotation Stretch with [place] - 1 x daily - 7 x weekly - 1 sets - 2 reps - 15-30 hold

## 2020-01-09 ENCOUNTER — Encounter: Payer: 59 | Admitting: Rehabilitative and Restorative Service Providers"

## 2020-01-11 ENCOUNTER — Encounter: Payer: Self-pay | Admitting: Physical Therapy

## 2020-01-11 ENCOUNTER — Other Ambulatory Visit: Payer: Self-pay

## 2020-01-11 ENCOUNTER — Ambulatory Visit (INDEPENDENT_AMBULATORY_CARE_PROVIDER_SITE_OTHER): Payer: 59 | Admitting: Physical Therapy

## 2020-01-11 DIAGNOSIS — M25512 Pain in left shoulder: Secondary | ICD-10-CM

## 2020-01-11 DIAGNOSIS — G8929 Other chronic pain: Secondary | ICD-10-CM

## 2020-01-11 DIAGNOSIS — M6281 Muscle weakness (generalized): Secondary | ICD-10-CM
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
## 2020-01-16 ENCOUNTER — Encounter: Payer: 59 | Admitting: Physical Therapy

## 2020-01-18 ENCOUNTER — Encounter: Payer: 59 | Admitting: Physical Therapy

## 2020-01-18 ENCOUNTER — Ambulatory Visit: Payer: 59 | Admitting: Sports Medicine

## 2020-04-13 DIAGNOSIS — K900 Celiac disease: Secondary | ICD-10-CM | POA: Insufficient documentation

## 2020-04-25 LAB — HM COLONOSCOPY

## 2020-05-02 ENCOUNTER — Encounter: Payer: Self-pay | Admitting: Family Medicine

## 2020-07-05 ENCOUNTER — Ambulatory Visit (INDEPENDENT_AMBULATORY_CARE_PROVIDER_SITE_OTHER): Payer: 59

## 2020-07-05 ENCOUNTER — Other Ambulatory Visit: Payer: Self-pay

## 2020-07-05 ENCOUNTER — Ambulatory Visit: Payer: 59 | Admitting: Sports Medicine

## 2020-07-05 DIAGNOSIS — M170 Bilateral primary osteoarthritis of knee: Secondary | ICD-10-CM | POA: Insufficient documentation

## 2020-07-05 DIAGNOSIS — M1711 Unilateral primary osteoarthritis, right knee: Secondary | ICD-10-CM | POA: Diagnosis not present

## 2020-07-05 DIAGNOSIS — Z09 Encounter for follow-up examination after completed treatment for conditions other than malignant neoplasm: Secondary | ICD-10-CM

## 2020-07-05 NOTE — Assessment & Plan Note (Signed)
Is a pleasant 50 year old female, she is post gastric bypass with some good weight loss, unfortunately she has been having increasing pain in her right knee, posterior aspect, swelling, no overt mechanical symptoms but there is significant gelling. We are unable to use oral steroids or NSAIDs due to her gastric surgery. On exam she has a moderate effusion, tenderness at the joint lines, negative McMurray's sign, aspiration and injection today, x-rays, home rehab exercises given, return to see me in 4 weeks, viscosupplementation if no better. Of note she is complaining of some interphalangeal joint swelling, if she continues to have oligoarticular arthralgias we will consider rheumatoid testing.

## 2020-07-05 NOTE — Progress Notes (Signed)
    Procedures performed today:    Procedure: Real-time Ultrasound Guided aspiration/injection of right knee Device: Samsung HS60  Verbal informed consent obtained.  Time-out conducted.  Noted no overlying erythema, induration, or other signs of local infection.  Skin prepped in a sterile fashion.  Local anesthesia: Topical Ethyl chloride.  With sterile technique and under real time ultrasound guidance:  Using an 18-gauge needle aspirated 13 mL of clear, straw-colored fluid, syringe switched and 1 cc Kenalog 40, 2 cc lidocaine, 2 cc bupivacaine injected easily Completed without difficulty  Advised to call if fevers/chills, erythema, induration, drainage, or persistent bleeding.  Images permanently stored and available for review in PACS.  Impression: Technically successful ultrasound guided injection.  Independent interpretation of notes and tests performed by another provider:   None.  Brief History, Exam, Impression, and Recommendations:    Primary osteoarthritis of right knee Is a pleasant 50 year old female, she is post gastric bypass with some good weight loss, unfortunately she has been having increasing pain in her right knee, posterior aspect, swelling, no overt mechanical symptoms but there is significant gelling. We are unable to use oral steroids or NSAIDs due to her gastric surgery. On exam she has a moderate effusion, tenderness at the joint lines, negative McMurray's sign, aspiration and injection today, x-rays, home rehab exercises given, return to see me in 4 weeks, viscosupplementation if no better. Of note she is complaining of some interphalangeal joint swelling, if she continues to have oligoarticular arthralgias we will consider rheumatoid testing.    ___________________________________________ Ihor Austin. Benjamin Stain, M.D., ABFM., CAQSM. Primary Care and Sports Medicine [facility]  Adjunct Instructor of Family Medicine  [employer]

## 2020-08-02 ENCOUNTER — Ambulatory Visit: Payer: 59 | Admitting: Sports Medicine

## 2020-08-02 ENCOUNTER — Other Ambulatory Visit: Payer: Self-pay

## 2020-08-02 DIAGNOSIS — M1711 Unilateral primary osteoarthritis, right knee: Secondary | ICD-10-CM

## 2020-08-02 NOTE — Progress Notes (Signed)
    Procedures performed today:    None.  Independent interpretation of notes and tests performed by another provider:   None.  Brief History, Exam, Impression, and Recommendations:    Primary osteoarthritis of right knee This is a very pleasant 50 year old female, post gastric bypass, so we are unable to use NSAIDs. At the last visit she had a moderate effusion so we aspirated and injected her knee, she returns today for the most part pain-free, doing well, she will continue Tylenol as needed, return to see me as needed. Oligoarticular arthralgias at the PIPs have resolved, though if they return and morning stiffness lasts for more than 30 minutes or an hour we will consider rheumatoid testing.    ___________________________________________ Ihor Austin. Benjamin Stain, M.D., ABFM., CAQSM. Primary Care and Sports Medicine [facility]  Adjunct Instructor of Family Medicine  [employer]

## 2020-08-02 NOTE — Assessment & Plan Note (Signed)
This is a very pleasant 50 year old female, post gastric bypass, so we are unable to use NSAIDs. At the last visit she had a moderate effusion so we aspirated and injected her knee, she returns today for the most part pain-free, doing well, she will continue Tylenol as needed, return to see me as needed. Oligoarticular arthralgias at the PIPs have resolved, though if they return and morning stiffness lasts for more than 30 minutes or an hour we will consider rheumatoid testing.

## 2020-09-20 DIAGNOSIS — E538 Deficiency of other specified B group vitamins: Secondary | ICD-10-CM | POA: Insufficient documentation

## 2021-11-20 ENCOUNTER — Ambulatory Visit
Admission: EM | Admit: 2021-11-20 | Discharge: 2021-11-20 | Disposition: A | Discharge status: Home / Self Care | Payer: 59 | Attending: Family Medicine | Admitting: Family Medicine

## 2021-11-20 DIAGNOSIS — M542 Cervicalgia: Secondary | ICD-10-CM

## 2021-11-20 MED ORDER — PREDNISONE 50 MG PO TABS: ORAL_TABLET | ORAL | 0 refills | Status: DC

## 2021-11-20 MED ORDER — CYCLOBENZAPRINE HCL 5 MG PO TABS: 5.0000 mg | ORAL_TABLET | Freq: Three times a day (TID) | ORAL | 0 refills | Status: DC | PRN

## 2021-11-20 NOTE — Discharge Instructions (Signed)
Take the prednisone once a day.  Take with food May also take tylenol of needed Take muscle relaxer as needed,  may take one or two at night Ice or heat to area Call if not improving by next week.  May follow up with Dr "T"

## 2021-11-20 NOTE — ED Provider Notes (Signed)
[facility]    CSN: [id number] Arrival date & time: 11/20/21  1626      History   Chief Complaint Chief Complaint  Patient presents with   Shoulder Pain    LT    HPI Colleen [name] is a 51 y.o. female.   HPI  Patient has shoulder pain on the left.  Its been present for couple of days.  No accident or injury.  She does admit that she is under stress especially at her job.  It hurts with movement of her neck.  She previously had shoulder joint pain but this is different.  Past Medical History:  Diagnosis Date   Cough    Fatty liver disease, nonalcoholic    Hypertension    Obesity    Obesity     Patient Active Problem List   Diagnosis Date Noted   Primary osteoarthritis of right knee 07/05/2020   Chronic left shoulder pain 12/15/2019   Closed fracture of distal phalanx of second finger of right hand 07/12/2014   Cough 08/13/2012   Allergic rhinitis, cause unspecified 05/30/2010   Hyperlipemia 05/30/2010   DERMATITIS, ATOPIC 03/11/2010   OBESITY, UNSPECIFIED 08/02/2008   ADJUSTMENT DISORDER WITH ANXIETY 08/01/2008   FATTY LIVER DISEASE 08/01/2008   HYPERTENSION, BENIGN ESSENTIAL 12/02/2006    Past Surgical History:  Procedure Laterality Date   PILONIDAL CYST EXCISION      OB History   No obstetric history on file.      Home Medications    Prior to Admission medications   Medication Sig Start Date End Date Taking? Authorizing Provider  cyclobenzaprine (FLEXERIL) 5 MG tablet Take 1 tablet (5 mg total) by mouth 3 (three) times daily as needed for muscle spasms. 11/20/21  Yes Eustace Moore, MD  predniSONE (DELTASONE) 50 MG tablet Take once a day for 5 days.  Take with food 11/20/21  Yes Eustace Moore, MD  folic acid (FOLVITE) 1 MG tablet Take 1 mg by mouth daily. 11/12/21   [provider]  levonorgestrel (MIRENA) 20 MCG/24HR IUD 1 each by Intrauterine route once.    [provider]  Multiple Vitamins-Minerals  (THERA-M) TABS Take by mouth. 09/03/15   [provider]  Vitamin D, Ergocalciferol, (DRISDOL) 1.25 MG (50000 UNIT) CAPS capsule Take 50,000 Units by mouth once a week. 10/11/19   [provider]    Family History Family History  Problem Relation Age of Onset   Hypertension Mother    Diabetes Mother    Thyroid disease Mother    Asthma Mother    Cancer Father    Asthma Sister     Social History Social History   Tobacco Use   Smoking status: Never   Smokeless tobacco: Never  Substance Use Topics   Alcohol use: Yes    Comment: once every 1-2 months   Drug use: No     Allergies   Ace inhibitors and Nsaids   Review of Systems Review of Systems See HPI  Physical Exam Triage Vital Signs ED Triage Vitals  Enc Vitals Group     BP 11/20/21 1633 122/86     Pulse Rate 11/20/21 1633 71     Resp 11/20/21 1633 18     Temp 11/20/21 1633 98.3 F (36.8 C)     Temp Source 11/20/21 1633 Oral     SpO2 11/20/21 1633 98 %     Weight --      Height --  Head Circumference --      Peak Flow --      Pain Score 11/20/21 1635 3     Pain Loc --      Pain Edu? --      Excl. in [place]? --    No data found.  Updated Vital Signs BP 122/86 (BP Location: Right Arm)   Pulse 71   Temp 98.3 F (36.8 C) (Oral)   Resp 18   SpO2 98%      Physical Exam Constitutional:      General: She is not in acute distress.    Appearance: She is well-developed and normal weight.  HENT:     Head: Normocephalic and atraumatic.  Eyes:     Conjunctiva/sclera: Conjunctivae normal.     Pupils: Pupils are equal, round, and reactive to light.  Neck:     Comments: Tight muscles, tenderness in the left upper body of the trapezius muscle.  No focal neuro findings Cardiovascular:     Rate and Rhythm: Normal rate.  Pulmonary:     Effort: Pulmonary effort is normal. No respiratory distress.  Abdominal:     General: There is no distension.     Palpations: Abdomen is soft.   Musculoskeletal:        General: Normal range of motion.     Cervical back: Normal range of motion.  Skin:    General: Skin is warm and dry.  Neurological:     Mental Status: She is alert.     Sensory: No sensory deficit.     Motor: No weakness.     Coordination: Coordination normal.     Deep Tendon Reflexes: Reflexes normal.      UC Treatments / Results  Labs (all labs ordered are listed, but only abnormal results are displayed) Labs Reviewed - No data to display  EKG   Radiology No results found.  Procedures Procedures (including critical care time)  Medications Ordered in UC Medications - No data to display  Initial Impression / Assessment and Plan / UC Course  I have reviewed the triage vital signs and the nursing notes.  Pertinent labs & imaging results that were available during my care of the patient were reviewed by me and considered in my medical decision making (see chart for details).     Final Clinical Impressions(s) / UC Diagnoses   Final diagnoses:  Neck pain, musculoskeletal     Discharge Instructions      Take the prednisone once a day.  Take with food May also take tylenol of needed Take muscle relaxer as needed,  may take one or two at night Ice or heat to area Call if not improving by next week.  May follow up with Dr "T"   ED Prescriptions     Medication Sig Dispense Auth. Provider   predniSONE (DELTASONE) 50 MG tablet Take once a day for 5 days.  Take with food 5 tablet Raylene Everts, MD   cyclobenzaprine (FLEXERIL) 5 MG tablet Take 1 tablet (5 mg total) by mouth 3 (three) times daily as needed for muscle spasms. 30 tablet Raylene Everts, MD      PDMP not reviewed this encounter.   Raylene Everts, MD 11/20/21 1901

## 2021-11-20 NOTE — ED Triage Notes (Signed)
Pt c/o LT shoulder pain since Monday. Pain more localized in scapular area. Pain worse with movement. No known injury. Heat and ibuprofen prn.

## 2022-01-08 ENCOUNTER — Encounter: Payer: Self-pay | Admitting: Family Medicine

## 2022-01-16 ENCOUNTER — Ambulatory Visit
Admission: EM | Admit: 2022-01-16 | Discharge: 2022-01-16 | Disposition: A | Discharge status: Home / Self Care | Payer: 59 | Attending: Family Medicine | Admitting: Family Medicine

## 2022-01-16 ENCOUNTER — Encounter: Payer: Self-pay | Admitting: Emergency Medicine

## 2022-01-16 DIAGNOSIS — J111 Influenza due to unidentified influenza virus with other respiratory manifestations: Secondary | ICD-10-CM | POA: Diagnosis present

## 2022-01-16 DIAGNOSIS — N390 Urinary tract infection, site not specified: Secondary | ICD-10-CM | POA: Diagnosis not present

## 2022-01-16 LAB — POCT URINALYSIS DIP (MANUAL ENTRY)
Bilirubin, UA: NEGATIVE
Glucose, UA: NEGATIVE mg/dL
Ketones, POC UA: NEGATIVE mg/dL
Nitrite, UA: POSITIVE — AB
Spec Grav, UA: 1.01 (ref 1.010–1.025)
Urobilinogen, UA: 0.2 E.U./dL
pH, UA: 6.5 (ref 5.0–8.0)

## 2022-01-16 LAB — POCT INFLUENZA A/B
Influenza A, POC: NEGATIVE
Influenza B, POC: NEGATIVE

## 2022-01-16 LAB — POC SARS CORONAVIRUS 2 AG -  ED: SARS Coronavirus 2 Ag: NEGATIVE

## 2022-01-16 MED ORDER — CEPHALEXIN 500 MG PO CAPS: 500.0000 mg | ORAL_CAPSULE | Freq: Two times a day (BID) | ORAL | 0 refills | Status: DC

## 2022-01-16 NOTE — ED Triage Notes (Signed)
Fever the last 2 days  of 101 in the evening Muscle aches  Cough & chills the 3 days  Azo last night  Mucinex DM for cough Tylenol on Tuesday  Bladder pressure after voiding x 2 weeks

## 2022-01-16 NOTE — ED Provider Notes (Signed)
[facility]    CSN: [id number] Arrival date & time: 01/16/22  3009      History   Chief Complaint Chief Complaint  Patient presents with   Fever    HPI Colleen [name] is a 51 y.o. female.   HPI  Patient has 2 medical problems today.  First think she had a bladder infection for about the last 2 weeks.  She has some pressure and voiding changes.  Sometimes dysuria.  It did not feel real typical for urinary tract infection so she was pushing fluids and hoping for improvement.  No abdominal pain, flank pain, fever or chills  Patient states for the last 2 days she has had some muscle aches, headache, cough and chills with temperature to 102.  She feels this is a respiratory illness of some sort.  She is taking over-the-counter Mucinex.  Past Medical History:  Diagnosis Date   Cough    Fatty liver disease, nonalcoholic    Hypertension    Obesity    Obesity     Patient Active Problem List   Diagnosis Date Noted   Primary osteoarthritis of right knee 07/05/2020   Chronic left shoulder pain 12/15/2019   Closed fracture of distal phalanx of second finger of right hand 07/12/2014   Cough 08/13/2012   Allergic rhinitis, cause unspecified 05/30/2010   Hyperlipemia 05/30/2010   DERMATITIS, ATOPIC 03/11/2010   OBESITY, UNSPECIFIED 08/02/2008   ADJUSTMENT DISORDER WITH ANXIETY 08/01/2008   FATTY LIVER DISEASE 08/01/2008   HYPERTENSION, BENIGN ESSENTIAL 12/02/2006    Past Surgical History:  Procedure Laterality Date   BARIATRIC SURGERY     PILONIDAL CYST EXCISION      OB History   No obstetric history on file.      Home Medications    Prior to Admission medications   Medication Sig Start Date End Date Taking? Authorizing Provider  cephALEXin (KEFLEX) 500 MG capsule Take 1 capsule (500 mg total) by mouth 2 (two) times daily. 01/16/22  Yes Eustace Moore, MD  folic acid (FOLVITE) 1 MG tablet Take 1 mg by mouth daily. 11/12/21   [provider]  iron polysaccharides (NIFEREX) 150 MG capsule TAKE ONE CAPSULE BY MOUTH 2 TIMES DAILY.    [provider]  levonorgestrel (MIRENA) 20 MCG/24HR IUD 1 each by Intrauterine route once.    [provider]  Multiple Vitamins-Minerals (THERA-M) TABS Take by mouth. 09/03/15   [provider]  Vitamin D, Ergocalciferol, (DRISDOL) 1.25 MG (50000 UNIT) CAPS capsule Take 50,000 Units by mouth once a week. 10/11/19   [provider]    Family History Family History  Problem Relation Age of Onset   Hypertension Mother    Diabetes Mother    Thyroid disease Mother    Asthma Mother    Cancer Father    Asthma Sister     Social History Social History   Tobacco Use   Smoking status: Never    Passive exposure: Never   Smokeless tobacco: Never  Vaping Use   Vaping Use: Never used  Substance Use Topics   Alcohol use: Not Currently    Comment: once every 1-2 months   Drug use: No     Allergies   Ace inhibitors and Nsaids   Review of Systems Review of Systems See HPI  Physical Exam Triage Vital Signs ED Triage Vitals  Enc Vitals Group     BP 01/16/22 1009 119/87     Pulse Rate 01/16/22 1009  90     Resp 01/16/22 1009 16     Temp 01/16/22 1009 98.8 F (37.1 C)     Temp Source 01/16/22 1009 Oral     SpO2 01/16/22 1009 96 %     Weight 01/16/22 1012 165 lb (74.8 kg)     Height 01/16/22 1012 5\' 4"  (1.626 m)     Head Circumference --      Peak Flow --      Pain Score 01/16/22 1011 4     Pain Loc --      Pain Edu? --      Excl. in [place]? --    No data found.  Updated Vital Signs BP 119/87 (BP Location: Left Arm)   Pulse 90   Temp 98.8 F (37.1 C) (Oral)   Resp 16   Ht 5\' 4"  (1.626 m)   Wt 74.8 kg   SpO2 96%   BMI 28.32 kg/m       Physical Exam Constitutional:      General: She is not in acute distress.    Appearance: She is well-developed. She is ill-appearing.  HENT:     Head: Normocephalic and atraumatic.  Eyes:      Conjunctiva/sclera: Conjunctivae normal.     Pupils: Pupils are equal, round, and reactive to light.  Cardiovascular:     Rate and Rhythm: Normal rate.     Heart sounds: Normal heart sounds.  Pulmonary:     Effort: Pulmonary effort is normal. No respiratory distress.     Breath sounds: Normal breath sounds.  Abdominal:     General: There is no distension.     Palpations: Abdomen is soft.     Tenderness: There is no abdominal tenderness. There is no right CVA tenderness or left CVA tenderness.  Musculoskeletal:        General: Normal range of motion.     Cervical back: Normal range of motion.  Skin:    General: Skin is warm and dry.  Neurological:     Mental Status: She is alert.      UC Treatments / Results  Labs (all labs ordered are listed, but only abnormal results are displayed) Labs Reviewed  POCT URINALYSIS DIP (MANUAL ENTRY) - Abnormal; Notable for the following components:      Result Value   Blood, UA trace-intact (*)    Protein Ur, POC trace (*)    Nitrite, UA Positive (*)    Leukocytes, UA Moderate (2+) (*)    All other components within normal limits  URINE CULTURE  POC SARS CORONAVIRUS 2 AG -  ED  POCT INFLUENZA A/B   Influenza test and COVID tests are negative EKG   Radiology No results found.  Procedures Procedures (including critical care time)  Medications Ordered in UC Medications - No data to display  Initial Impression / Assessment and Plan / UC Course  I have reviewed the triage vital signs and the nursing notes.  Pertinent labs & imaging results that were available during my care of the patient were reviewed by me and considered in my medical decision making (see chart for details).     COVID and flu tests are negative.  Explained the patient's fever and cough was an upper respiratory viral infection.  Symptomatic care recommended Urine test is abnormal with positive nitrites.  Patient states she did take an Azo last night but the urine  is not orange in color.  In any event we will cover her for  urinary tract infection with some Keflex. Follow-up with [facility] care if not improving by next week Final Clinical Impressions(s) / UC Diagnoses   Final diagnoses:  Influenza-like illness  Lower urinary tract infection, acute     Discharge Instructions      Drink lots of water Over-the-counter cough and cold medicine as needed Take the Keflex antibiotics 2 times a day  The urine culture report will be available in 2 to 3 days.  You will be called if any change in antibiotics is needed     ED Prescriptions     Medication Sig Dispense Auth. Provider   cephALEXin (KEFLEX) 500 MG capsule Take 1 capsule (500 mg total) by mouth 2 (two) times daily. 14 capsule Eustace Moore, MD      PDMP not reviewed this encounter.   Eustace Moore, MD 01/16/22 1104

## 2022-01-16 NOTE — Discharge Instructions (Signed)
Drink lots of water Over-the-counter cough and cold medicine as needed Take the Keflex antibiotics 2 times a day  The urine culture report will be available in 2 to 3 days.  You will be called if any change in antibiotics is needed

## 2022-01-18 LAB — URINE CULTURE: Culture: >=100000 — AB

## 2022-01-31 IMAGING — DX DG KNEE COMPLETE 4+V*R*
3 series · 3 of 3 positions shown · non-contrast
Comparison: None.

CLINICAL DATA: Right knee pain for several days, no known injury,
initial encounter

EXAM:
RIGHT KNEE - COMPLETE 4+ VIEW

[tunnel]
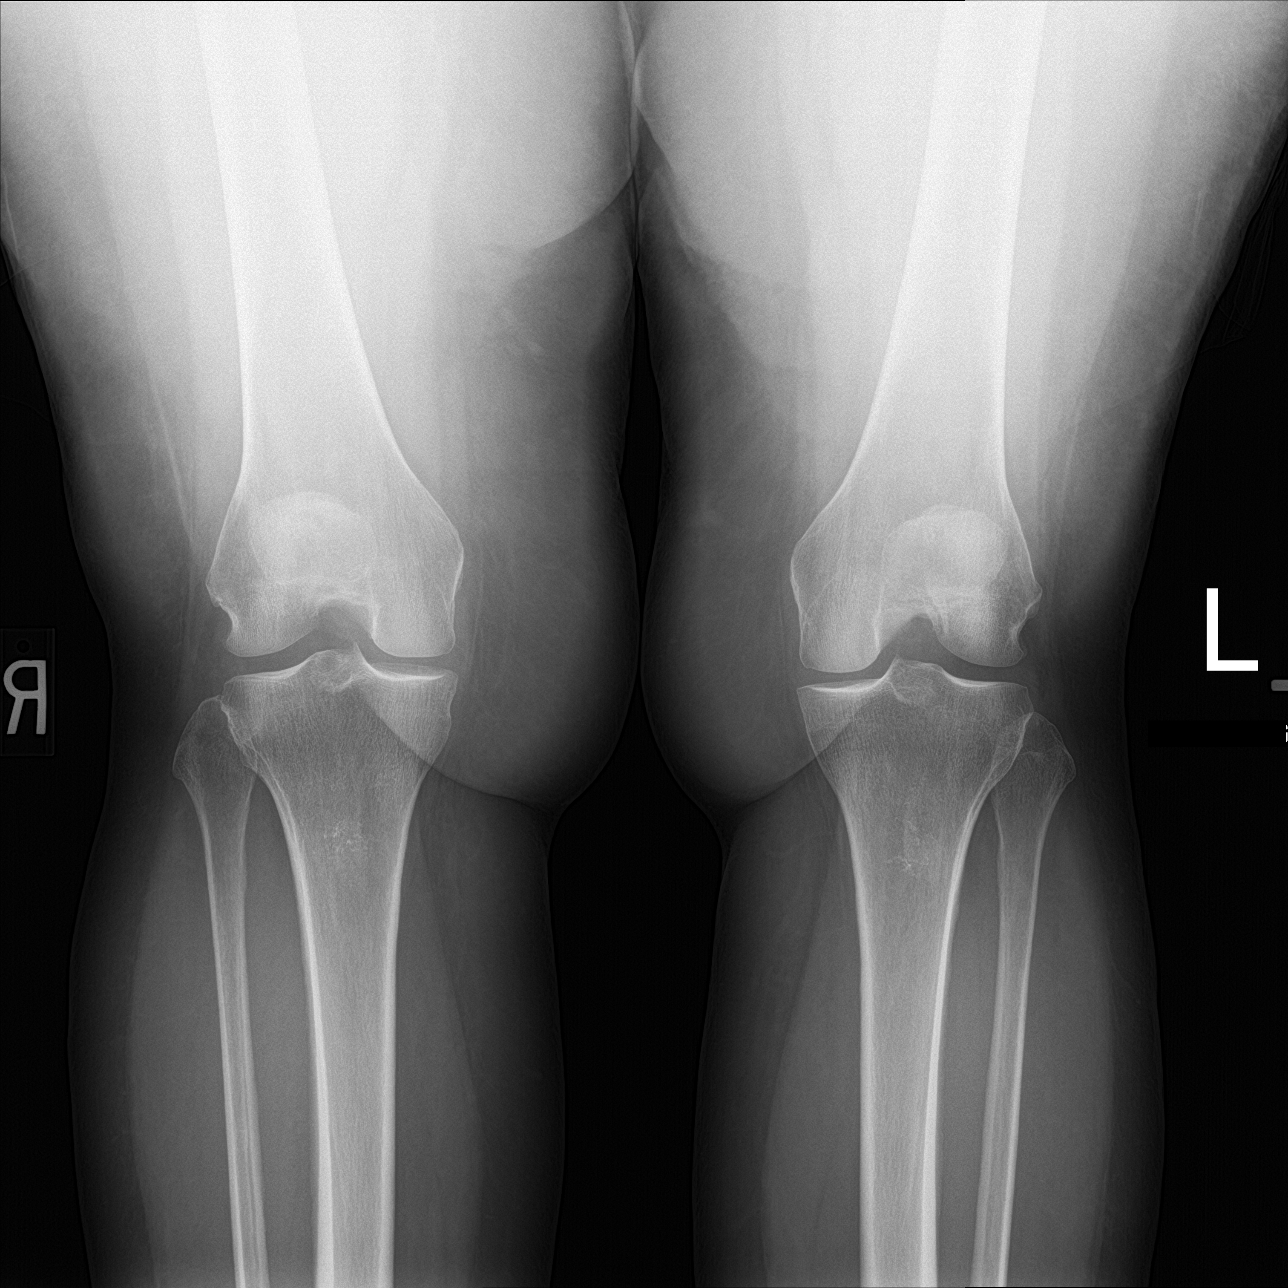

[knee sunrise]
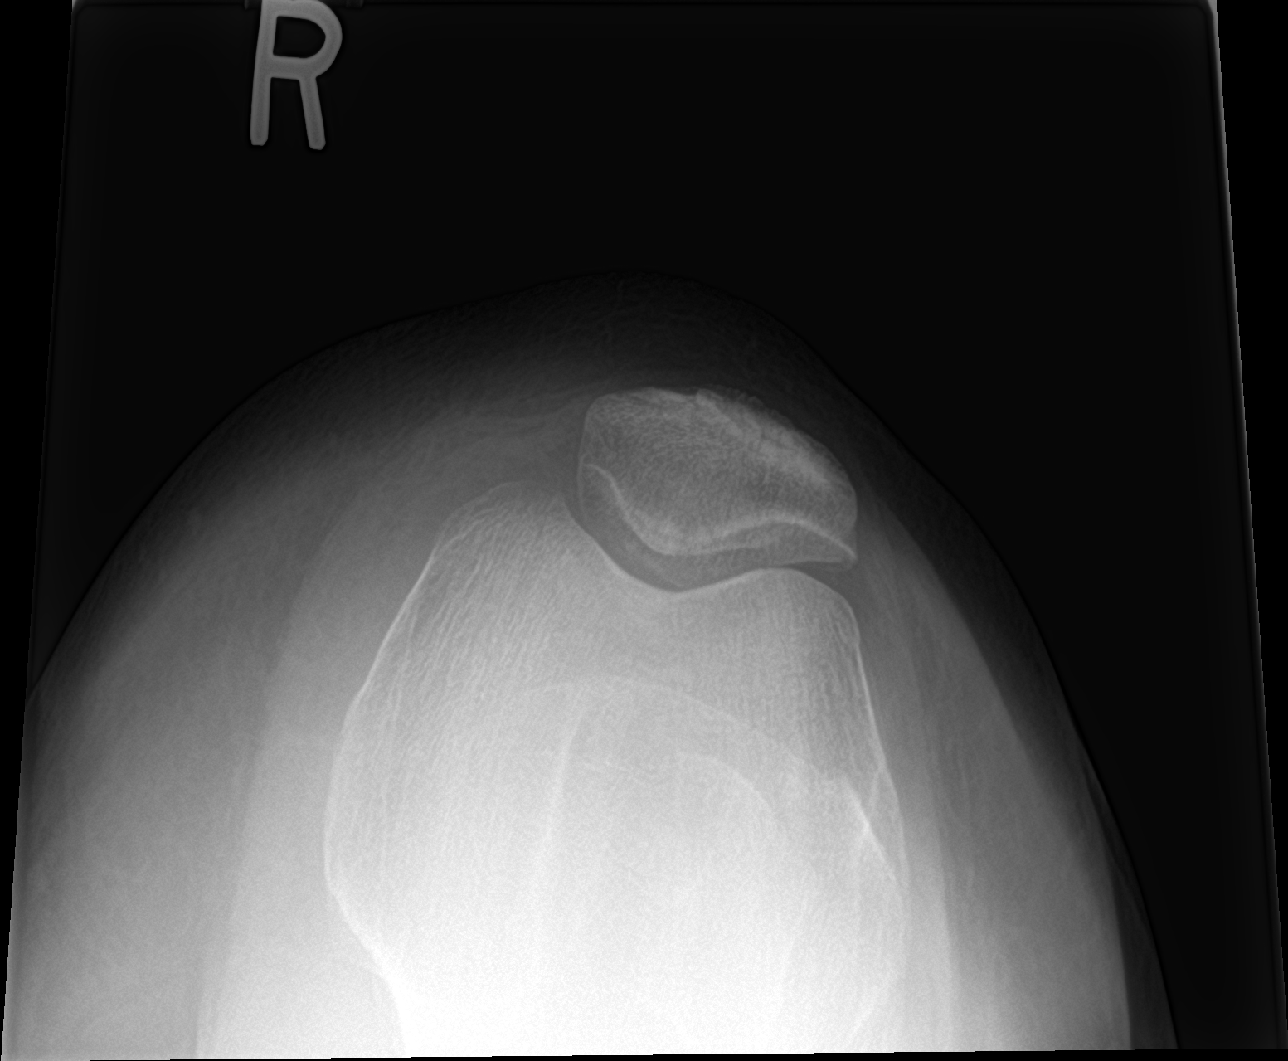

[knee ap bilat standing]
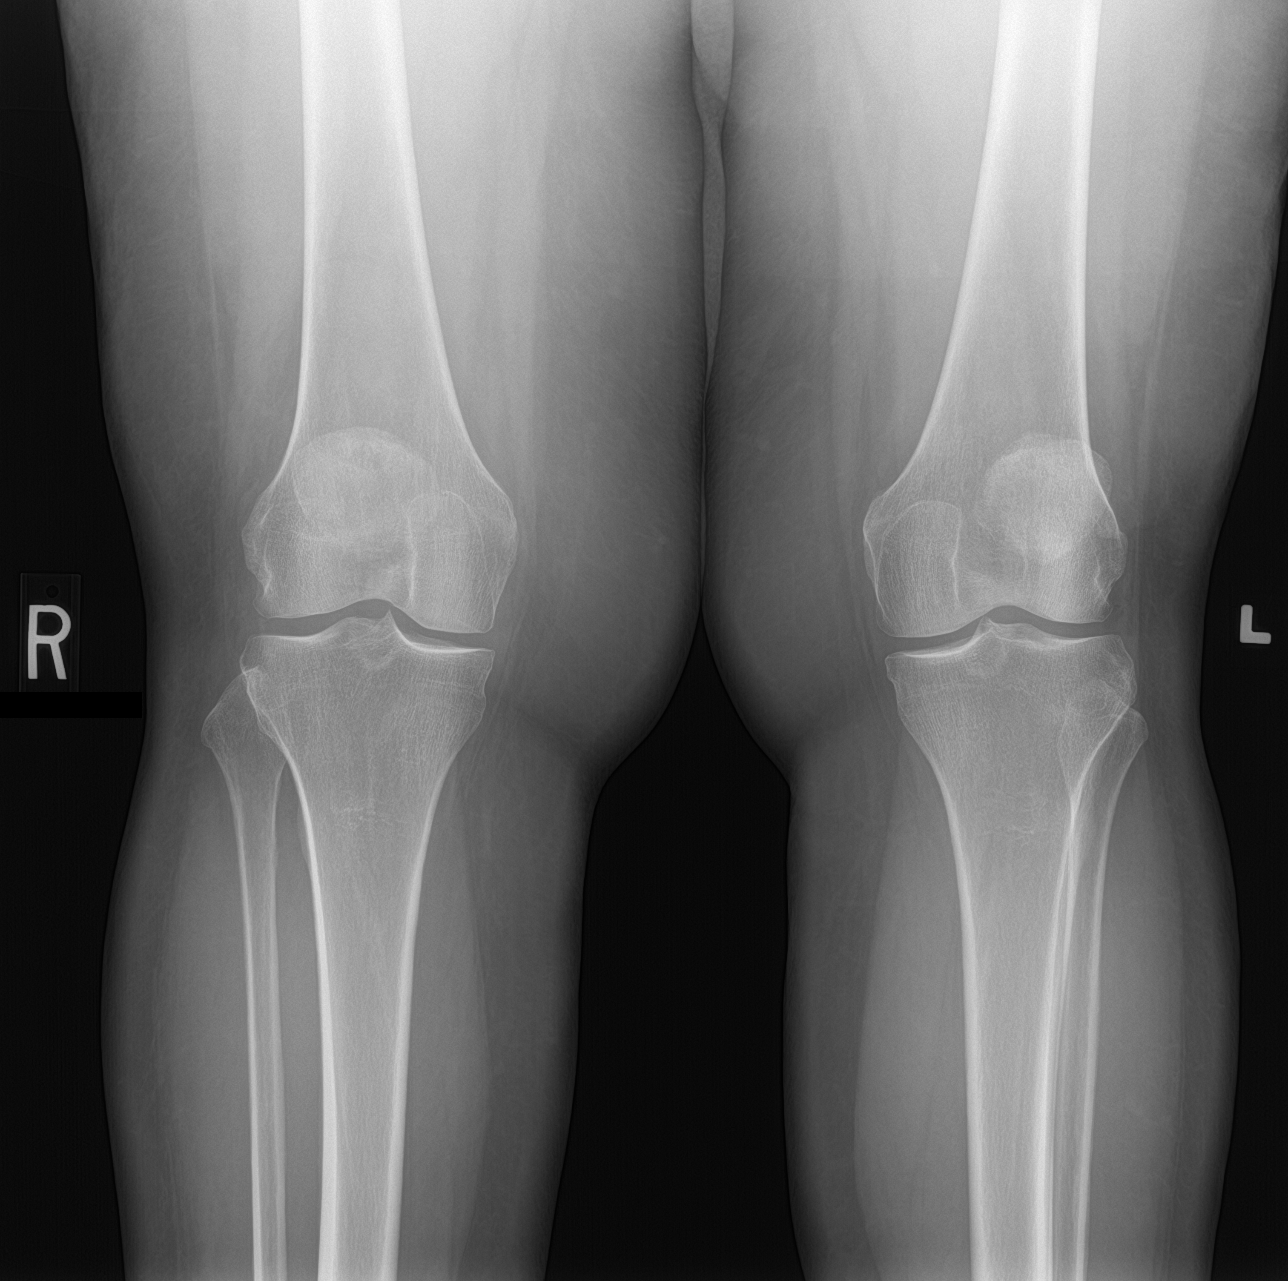

[3 of 3 positions shown; findings below may reference images not displayed]

FINDINGS: Mild patellofemoral degenerative changes are seen. No joint effusion
is noted. No acute fracture or dislocation is seen.
IMPRESSION: Degenerative changes without acute abnormality.

## 2022-06-15 ENCOUNTER — Telehealth: Payer: 59 | Admitting: Family

## 2022-06-15 DIAGNOSIS — R399 Unspecified symptoms and signs involving the genitourinary system: Secondary | ICD-10-CM

## 2022-06-15 MED ORDER — CEPHALEXIN 500 MG PO CAPS: 500.0000 mg | ORAL_CAPSULE | Freq: Two times a day (BID) | ORAL | 0 refills | Status: DC

## 2022-06-15 NOTE — Progress Notes (Signed)
Virtual Visit Consent   Colleen [name], you are scheduled for a virtual visit with a [facility] provider today. Just as with appointments in the office, your consent must be obtained to participate. Your consent will be active for this visit and any virtual visit you may have with one of our providers in the next 365 days. If you have a MyChart account, a copy of this consent can be sent to you electronically.  As this is a virtual visit, video technology does not allow for your provider to perform a traditional examination. This may limit your provider's ability to fully assess your condition. If your provider identifies any concerns that need to be evaluated in person or the need to arrange testing (such as labs, EKG, etc.), we will make arrangements to do so. Although advances in technology are sophisticated, we cannot ensure that it will always work on either your end or our end. If the connection with a video visit is poor, the visit may have to be switched to a telephone visit. With either a video or telephone visit, we are not always able to ensure that we have a secure connection.  By engaging in this virtual visit, you consent to the provision of healthcare and authorize for your insurance to be billed (if applicable) for the services provided during this visit. Depending on your insurance coverage, you may receive a charge related to this service.  I need to obtain your verbal consent now. Are you willing to proceed with your visit today? Melodye Vannesa Fogelberg has provided verbal consent on 06/15/2022 for a virtual visit (video or telephone). Jannifer Rodney, FNP  Date: 06/15/2022 5:04 PM  Virtual Visit via Video Note   I, Jannifer Rodney, connected with  Colleen [name]  ([id number], January 16, 1971) on 06/15/22 at  5:00 PM EDT by a video-enabled telemedicine application and verified that I am speaking with the correct person using two identifiers.  Location: Patient: Virtual Visit  Location Patient: Home Provider: Virtual Visit Location Provider: Home Office   I discussed the limitations of evaluation and management by telemedicine and the availability of in person appointments. The patient expressed understanding and agreed to proceed.    History of Present Illness: Colleen [name] is a 52 y.o. who identifies as a female who was assigned female at birth, and is being seen today for UTI symptoms that started two days ago.  HPI: Dysuria  This is a new problem. The current episode started in the past 7 days. The problem has been gradually worsening. The quality of the pain is described as aching and burning. The pain is at a severity of 6/10. The pain is mild. Associated symptoms include frequency, hesitancy and urgency. Pertinent negatives include no discharge, flank pain, hematuria, nausea or vomiting. She has tried increased fluids for the symptoms. The treatment provided mild relief.    Problems:  Patient Active Problem List   Diagnosis Date Noted   Primary osteoarthritis of right knee 07/05/2020   Chronic left shoulder pain 12/15/2019   Closed fracture of distal phalanx of second finger of right hand 07/12/2014   Cough 08/13/2012   Allergic rhinitis, cause unspecified 05/30/2010   Hyperlipemia 05/30/2010   DERMATITIS, ATOPIC 03/11/2010   OBESITY, UNSPECIFIED 08/02/2008   ADJUSTMENT DISORDER WITH ANXIETY 08/01/2008   FATTY LIVER DISEASE 08/01/2008   HYPERTENSION, BENIGN ESSENTIAL 12/02/2006    Allergies:  Allergies  Allergen Reactions   Ace Inhibitors     REACTION: cough   Nsaids  Bariatric surgery. Should not take.   Medications:  Current Outpatient Medications:    cephALEXin (KEFLEX) 500 MG capsule, Take 1 capsule (500 mg total) by mouth 2 (two) times daily., Disp: 14 capsule, Rfl: 0   folic acid (FOLVITE) 1 MG tablet, Take 1 mg by mouth daily., Disp: , Rfl:    iron polysaccharides (NIFEREX) 150 MG capsule, TAKE ONE CAPSULE BY MOUTH 2 TIMES  DAILY., Disp: , Rfl:    levonorgestrel (MIRENA) 20 MCG/24HR IUD, 1 each by Intrauterine route once., Disp: , Rfl:    Multiple Vitamins-Minerals (THERA-M) TABS, Take by mouth., Disp: , Rfl:    Vitamin D, Ergocalciferol, (DRISDOL) 1.25 MG (50000 UNIT) CAPS capsule, Take 50,000 Units by mouth once a week., Disp: , Rfl:   Observations/Objective: Patient is well-developed, well-nourished in no acute distress.  Resting comfortably  at home.  Head is normocephalic, atraumatic.  No labored breathing.  Speech is clear and coherent with logical content.  Patient is alert and oriented at baseline.    Assessment and Plan: 1. UTI symptoms - cephALEXin (KEFLEX) 500 MG capsule; Take 1 capsule (500 mg total) by mouth 2 (two) times daily.  Dispense: 14 capsule; Refill: 0  Force fluids Start Keflex  AZO over the counter X2 days Follow up if symptoms worsen or do not improve   Follow Up Instructions: I discussed the assessment and treatment plan with the patient. The patient was provided an opportunity to ask questions and all were answered. The patient agreed with the plan and demonstrated an understanding of the instructions.  A copy of instructions were sent to the patient via MyChart unless otherwise noted below.     The patient was advised to call back or seek an in-person evaluation if the symptoms worsen or if the condition fails to improve as anticipated.  Time:  I spent 6 minutes with the patient via telehealth technology discussing the above problems/concerns.    Jannifer Rodney, FNP

## 2022-06-24 ENCOUNTER — Other Ambulatory Visit (INDEPENDENT_AMBULATORY_CARE_PROVIDER_SITE_OTHER): Payer: 59

## 2022-06-24 ENCOUNTER — Ambulatory Visit: Payer: 59 | Admitting: Sports Medicine

## 2022-06-24 ENCOUNTER — Encounter: Payer: Self-pay | Admitting: Sports Medicine

## 2022-06-24 VITALS — BP 128/88 | HR 68 | Ht 64.0 in | Wt 177.5 lb

## 2022-06-24 DIAGNOSIS — M170 Bilateral primary osteoarthritis of knee: Secondary | ICD-10-CM

## 2022-06-24 NOTE — Progress Notes (Signed)
    Procedures performed today:    Procedure: Real-time Ultrasound Guided aspiration/injection of left knee Device: Samsung [id number]  Verbal informed consent obtained.  Time-out conducted.  Noted no overlying erythema, induration, or other signs of local infection.  Skin prepped in a sterile fashion.  Local anesthesia: Topical Ethyl chloride.  With sterile technique and under real time ultrasound guidance: Noted effusion, aspirated 22 mL of clear, straw-colored fluid, syringe switched and 1 cc Kenalog 40, 2 cc lidocaine, 2 cc bupivacaine injected easily Completed without difficulty  Advised to call if fevers/chills, erythema, induration, drainage, or persistent bleeding.  Images permanently stored and available for review in PACS.  Impression: Technically successful ultrasound guided aspiration/injection.  Independent interpretation of notes and tests performed by another provider:   None.  Brief History, Exam, Impression, and Recommendations:    Primary osteoarthritis of both knees This is a very pleasant 52 year old female, we saw her about 2 years ago for right knee pain, we aspirated and injected her knee and she had fantastic relief, right knee continues to do well but she is now having increasing pain left knee, moderate effusion, we did an aspiration and injection today, return to see me in 6 weeks for this, adding some home physical therapy. She will let me know if she is not consistent with the home therapy and we will switch to formal PT.    ____________________________________________ Ihor Austin. Benjamin Stain, M.D., ABFM., CAQSM., AME. Primary Care and Sports Medicine  [facility]  Adjunct Professor of Family Medicine  [employer]  Restaurant manager, fast food

## 2022-06-24 NOTE — Assessment & Plan Note (Signed)
This is a very pleasant 52 year old female, we saw her about 2 years ago for right knee pain, we aspirated and injected her knee and she had fantastic relief, right knee continues to do well but she is now having increasing pain left knee, moderate effusion, we did an aspiration and injection today, return to see me in 6 weeks for this, adding some home physical therapy. She will let me know if she is not consistent with the home therapy and we will switch to formal PT.

## 2022-08-05 ENCOUNTER — Ambulatory Visit: Payer: 59 | Admitting: Sports Medicine

## 2022-08-26 ENCOUNTER — Ambulatory Visit: Payer: 59 | Admitting: Family Medicine

## 2022-09-24 ENCOUNTER — Encounter: Payer: Self-pay | Admitting: Family Medicine

## 2022-09-24 ENCOUNTER — Ambulatory Visit: Payer: 59 | Admitting: Family Medicine

## 2022-09-24 VITALS — BP 108/75 | HR 79 | Ht 64.0 in | Wt 176.0 lb

## 2022-09-24 DIAGNOSIS — K900 Celiac disease: Secondary | ICD-10-CM | POA: Diagnosis not present

## 2022-09-24 DIAGNOSIS — Z0000 Encounter for general adult medical examination without abnormal findings: Secondary | ICD-10-CM

## 2022-09-24 DIAGNOSIS — Z23 Encounter for immunization: Secondary | ICD-10-CM

## 2022-09-24 DIAGNOSIS — R21 Rash and other nonspecific skin eruption: Secondary | ICD-10-CM

## 2022-09-24 MED ORDER — TRIAMCINOLONE ACETONIDE 0.5 % EX OINT: 1.0000 | TOPICAL_OINTMENT | Freq: Two times a day (BID) | CUTANEOUS | 0 refills | Status: DC

## 2022-09-24 NOTE — Progress Notes (Signed)
Complete physical exam  Patient: Colleen [name]   DOB: 25-Feb-1970   52 y.o. Female  MRN: [id number]  Subjective:    Chief Complaint  Patient presents with   Establish Care   Annual Exam    Colleen [name] is a 52 y.o. female who presents today for a complete physical exam. She reports consuming a general diet.  Planning on starting an exercise program.    She reports that she is sleeps well.  She generally feels well. . She does have additional problems to discuss today.   Since I last saw her she did have bariatric surgery in 2017 with Dr. Jerald Kief but he is now retired so she has been following regularly with the PA there.  She just had some labs done recently.   Most recent fall risk assessment:    06/24/2022   11:22 AM  Fall Risk   Falls in the past year? 0  Number falls in past yr: 0  Injury with Fall? 0  Risk for fall due to : No Fall Risks  Follow up Falls evaluation completed     Most recent depression screenings:    06/24/2022   11:22 AM  PHQ 2/9 Scores  PHQ - 2 Score 0        Patient Care Team: Agapito Games, MD as PCP - General (Family Medicine) Storm Frisk, MD as Attending Physician (Pulmonary Disease) Freddy Finner, MD (Inactive) as Consulting Physician (Obstetrics and Gynecology)   Outpatient Medications Prior to Visit  Medication Sig   folic acid (FOLVITE) 1 MG tablet Take 1 mg by mouth daily.   iron polysaccharides (NIFEREX) 150 MG capsule TAKE ONE CAPSULE BY MOUTH 2 TIMES DAILY.   levonorgestrel (MIRENA) 20 MCG/24HR IUD 1 each by Intrauterine route once.   Multiple Vitamins-Minerals (THERA-M) TABS Take by mouth.   Vitamin D, Ergocalciferol, (DRISDOL) 1.25 MG (50000 UNIT) CAPS capsule Take 50,000 Units by mouth once a week.   No facility-administered medications prior to visit.    ROS        Objective:     BP 108/75   Pulse 79   Ht 5\' 4"  (1.626 m)   Wt 176 lb (79.8 kg)   SpO2 100%   BMI 30.21 kg/m     Physical Exam Vitals and nursing note reviewed.  Constitutional:      Appearance: She is well-developed.  HENT:     Head: Normocephalic and atraumatic.  Cardiovascular:     Rate and Rhythm: Normal rate and regular rhythm.     Heart sounds: Normal heart sounds.  Pulmonary:     Effort: Pulmonary effort is normal.     Breath sounds: Normal breath sounds.  Skin:    General: Skin is warm and dry.  Neurological:     Mental Status: She is alert and oriented to person, place, and time.  Psychiatric:        Behavior: Behavior normal.      No results found for any visits on 09/24/22.     Assessment & Plan:    Routine Health Maintenance and Physical Exam  Immunization History  Administered Date(s) Administered   H1N1 02/02/2008   Hepatitis B, ADULT 01/20/2019, 02/21/2019, 07/20/2019   Influenza Whole 12/02/2006, 02/02/2008   Influenza,inj,Quad PF,6+ Mos 09/30/2013, 11/15/2015   Tdap 06/02/2011, 09/24/2022   Unspecified SARS-COV-2 Vaccination 03/21/2019, 05/01/2019    Health Maintenance  Topic Date Due   Zoster Vaccines- Shingrix (1 of 2) 09/24/2022 (Originally  08/17/2021)   INFLUENZA VACCINE  11/03/2022 (Originally 09/04/2022)   PAP SMEAR-Modifier  02/03/2023 (Originally 04/08/2020)   COVID-19 Vaccine (3 - 2023-24 season) 02/03/2023 (Originally 10/04/2021)   MAMMOGRAM  06/24/2023 (Originally 04/14/2018)   Colonoscopy  04/26/2030   DTaP/Tdap/Td (3 - Td or Tdap) 09/23/2032   Hepatitis C Screening  Completed   HIV Screening  Completed   Pneumococcal Vaccine 40-19 Years old  Aged Out   HPV VACCINES  Aged Out    Discussed health benefits of physical activity, and encouraged her to engage in regular exercise appropriate for her age and condition.  Problem List Items Addressed This Visit       Digestive   Celiac disease    Does have celiac disease and does break out with a rash intermittently if she gets into a little bit more gluten.      Other Visit Diagnoses      Encounter for immunization    -  Primary   Relevant Orders   Tdap vaccine greater than or equal to 7yo IM (Completed)   Wellness examination       Rash           Keep up a regular exercise program and make sure you are eating a healthy diet Try to eat 4 servings of dairy a day, or if you are lactose intolerant take a calcium with vitamin D daily.  Your vaccines are up to date.   Also reports occasionally will get a rash right at the umbilicus it will turn really bright red and then the next day it looks like it dries over and by the third day it starts to fade and resolve.  It is usually associated with some drainage.  He she was able to show me some pictures.  Sometimes she uses Neosporin on it but sometimes she uses nothing but it always resolves on its own.  Not do labs today as she reports that she just had a whole bunch of labs done with the bariatric clinic so we will try to find a copy of those to review.  Do a make sure that we have an up-to-date lipid panel.  Meds ordered this encounter  Medications   triamcinolone ointment (KENALOG) 0.5 %    Sig: Apply 1 Application topically 2 (two) times daily.    Dispense:  30 g    Refill:  0      Return in about 1 year (around 09/24/2023) for Wellness Exam.     Nani Gasser, MD

## 2022-09-24 NOTE — Assessment & Plan Note (Signed)
Does have celiac disease and does break out with a rash intermittently if she gets into a little bit more gluten.

## 2022-10-29 ENCOUNTER — Telehealth: Payer: Self-pay | Admitting: Family Medicine

## 2022-10-29 DIAGNOSIS — Z1211 Encounter for screening for malignant neoplasm of colon: Secondary | ICD-10-CM

## 2022-10-29 DIAGNOSIS — Z124 Encounter for screening for malignant neoplasm of cervix: Secondary | ICD-10-CM

## 2022-10-29 NOTE — Telephone Encounter (Signed)
Form completed. Pls make copy.  See note below.  Also please ask her about her last pap smear.

## 2022-10-29 NOTE — Telephone Encounter (Signed)
Form faxed,confirmation received and scanned into patient's chart.  Pt would like a referral for GYN down the hall.

## 2022-10-29 NOTE — Telephone Encounter (Signed)
Patient dropped off document Insurance Form, to be filled out by provider. Patient requested to send it back via mychart within 5-days. Document is located in providers tray at front office.Please advise at Mobile [phone] (mobile)

## 2022-11-18 ENCOUNTER — Other Ambulatory Visit: Payer: Self-pay | Admitting: Family Medicine

## 2022-11-18 ENCOUNTER — Other Ambulatory Visit: Payer: Self-pay | Admitting: Family

## 2022-11-18 DIAGNOSIS — R399 Unspecified symptoms and signs involving the genitourinary system: Secondary | ICD-10-CM

## 2023-02-04 HISTORY — PX: PANNICULECTOMY: SUR1001

## 2023-02-24 ENCOUNTER — Ambulatory Visit: Payer: 59 | Admitting: Sports Medicine

## 2023-02-24 ENCOUNTER — Other Ambulatory Visit (INDEPENDENT_AMBULATORY_CARE_PROVIDER_SITE_OTHER): Payer: 59

## 2023-02-24 DIAGNOSIS — H01021 Squamous blepharitis right upper eyelid: Secondary | ICD-10-CM

## 2023-02-24 DIAGNOSIS — M170 Bilateral primary osteoarthritis of knee: Secondary | ICD-10-CM

## 2023-02-24 DIAGNOSIS — H01024 Squamous blepharitis left upper eyelid: Secondary | ICD-10-CM | POA: Diagnosis not present

## 2023-02-24 DIAGNOSIS — H01001 Unspecified blepharitis right upper eyelid: Secondary | ICD-10-CM | POA: Insufficient documentation

## 2023-02-24 DIAGNOSIS — M7542 Impingement syndrome of left shoulder: Secondary | ICD-10-CM

## 2023-02-24 MED ORDER — HYDROCORTISONE 2.5 % EX OINT: TOPICAL_OINTMENT | CUTANEOUS | 3 refills | Status: DC

## 2023-02-24 NOTE — Assessment & Plan Note (Signed)
Pleasant 53 year old female, bilateral knee osteoarthritis, right knee was last injected several years ago, left knee back in May 2024. Now having recurrence of left knee pain, on exam she has mild swelling, pain posteriorly, she is doing home conditioning, due to pain posteriorly and with terminal flexion I do suspect meniscal tearing, we injected her knee today with steroid, adding an MRI. Return to see me in 6 weeks.

## 2023-02-24 NOTE — Assessment & Plan Note (Signed)
Pain left shoulder much better with rehab exercises, I will add more of an advanced rotator cuff conditioning program and she can return to see me as needed for this.

## 2023-02-24 NOTE — Assessment & Plan Note (Signed)
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 If ineffective we can switch to something like [place].

## 2023-02-24 NOTE — Progress Notes (Signed)
    Procedures performed today:    Procedure: Real-time Ultrasound Guided injection of the left knee Device: Samsung HS60  Verbal informed consent obtained.  Time-out conducted.  Noted no overlying erythema, induration, or other signs of local infection.  Skin prepped in a sterile fashion.  Local anesthesia: Topical Ethyl chloride.  With sterile technique and under real time ultrasound guidance: Mild effusion noted, 1 cc Kenalog 40, 2 cc lidocaine, 2 cc bupivacaine injected easily Completed without difficulty  Advised to call if fevers/chills, erythema, induration, drainage, or persistent bleeding.  Images permanently stored and available for review in PACS.  Impression: Technically successful ultrasound guided injection.  Independent interpretation of notes and tests performed by another provider:   None.  Brief History, Exam, Impression, and Recommendations:    Primary osteoarthritis of both knees Pleasant 53 year old female, bilateral knee osteoarthritis, right knee was last injected several years ago, left knee back in May 2024. Now having recurrence of left knee pain, on exam she has mild swelling, pain posteriorly, she is doing home conditioning, due to pain posteriorly and with terminal flexion I do suspect meniscal tearing, we injected her knee today with steroid, adding an MRI. Return to see me in 6 weeks.  Impingement syndrome, shoulder, left Pain left shoulder much better with rehab exercises, I will add more of an advanced rotator cuff conditioning program and she can return to see me as needed for this.  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 If ineffective we can switch to something like Eucrisa.    ____________________________________________ Ihor Austin. Benjamin Stain, M.D., ABFM., CAQSM.,  AME. Primary Care and Sports Medicine [facility]  Adjunct Professor of Family Medicine  [place] [employer] [facility] [facility] [employer]  Restaurant manager, fast food

## 2023-02-25 DIAGNOSIS — M7542 Impingement syndrome of left shoulder: Secondary | ICD-10-CM | POA: Diagnosis not present

## 2023-02-25 DIAGNOSIS — M170 Bilateral primary osteoarthritis of knee: Secondary | ICD-10-CM | POA: Diagnosis not present

## 2023-02-25 MED ORDER — TRIAMCINOLONE ACETONIDE 40 MG/ML IJ SUSP
40.0000 mg | Freq: Once | INTRAMUSCULAR | Status: AC
  Administered 2023-02-25: 40 mg via INTRAMUSCULAR

## 2023-02-25 NOTE — Addendum Note (Signed)
Addended by: Carren Rang A on: 02/25/2023 01:50 PM   Modules accepted: Orders

## 2023-02-25 NOTE — Code Documentation (Signed)
done

## 2023-03-01 ENCOUNTER — Ambulatory Visit: Payer: 59

## 2023-03-01 DIAGNOSIS — M170 Bilateral primary osteoarthritis of knee: Secondary | ICD-10-CM

## 2023-03-01 DIAGNOSIS — M25562 Pain in left knee: Secondary | ICD-10-CM | POA: Diagnosis not present

## 2023-03-01 DIAGNOSIS — G8929 Other chronic pain: Secondary | ICD-10-CM

## 2023-04-01 ENCOUNTER — Other Ambulatory Visit (HOSPITAL_COMMUNITY)
Admission: RE | Admit: 2023-04-01 | Discharge: 2023-04-01 | Disposition: A | Discharge status: Home / Self Care | Payer: 59 | Source: Ambulatory Visit | Attending: Obstetrics and Gynecology | Admitting: Obstetrics and Gynecology

## 2023-04-01 ENCOUNTER — Encounter: Payer: Self-pay | Admitting: Obstetrics and Gynecology

## 2023-04-01 ENCOUNTER — Ambulatory Visit: Payer: 59 | Admitting: Obstetrics and Gynecology

## 2023-04-01 VITALS — BP 128/89 | HR 72 | Ht 64.0 in | Wt 167.0 lb

## 2023-04-01 DIAGNOSIS — Z01419 Encounter for gynecological examination (general) (routine) without abnormal findings: Secondary | ICD-10-CM | POA: Insufficient documentation

## 2023-04-01 NOTE — Progress Notes (Signed)
   ANNUAL EXAM Patient name: Colleen [name] MRN [id number]  Date of birth: 08-12-1970 Chief Complaint:   Annual Exam  History of Present Illness:   Colleen [name] is a 53 y.o. [phone] with No LMP recorded. (Menstrual status: IUD). being seen today for a routine annual exam.  Current complaints: none  Last pap unsure. H/O abnormal pap: yes reports needing colposcopy & biopsies but never needing additional procedures. Notes this was several years ago Last mammogram: 2019. Results were: normal Last colonoscopy: 2022. Results were: normal     06/24/2022   11:22 AM  Depression screen PHQ 2/9  Decreased Interest 0  Down, Depressed, Hopeless 0  PHQ - 2 Score 0         No data to display         Review of Systems:   Pertinent items are noted in HPI Denies any headaches, blurred vision, fatigue, shortness of breath, chest pain, abdominal pain, abnormal vaginal discharge/itching/odor/irritation, problems with periods, bowel movements, urination, or intercourse unless otherwise stated above. Pertinent History Reviewed:  Reviewed past medical,surgical, social and family history.  Reviewed problem list, medications and allergies. Physical Assessment:   Vitals:   04/01/23 1031  BP: 128/89  Pulse: 72  Weight: 167 lb (75.8 kg)  Height: 5\' 4"  (1.626 m)  Body mass index is 28.67 kg/m.        Physical Examination:   General appearance - well appearing, and in no distress  Mental status - alert, oriented to person, place, and time  Chest - respiratory effort normal  Heart - normal peripheral perfusion  Breasts - breasts appear normal, no suspicious masses, no skin or nipple changes or axillary nodes  Abdomen - soft, nontender, nondistended, no masses or organomegaly  Pelvic - VULVA: normal appearing vulva with no masses, tenderness or lesions  VAGINA: normal appearing vagina with normal color and discharge, no lesions  CERVIX: normal appearing cervix without discharge or  lesions, no CMT  Thin prep pap is done with HR HPV cotesting  UTERUS: uterus is felt to be normal size, shape, consistency and nontender   ADNEXA: No adnexal masses or tenderness noted.  Chaperone present for exam  No results found for this or any previous visit (from the past 24 hours).  Assessment & Plan:  1) Well-Woman Exam Mammogram: schedule screening mammo as soon as possible Colonoscopy: per GI Pap: Collected Gardasil: n/a GC/CT: UTD HIV/HCV: UTD IUD in place for ~3 years (prior patient [facility]) and amenorrheic. Discussed keeping IUD for 2-5 more years to bridge patient through menopause and she agrees w/ plan.   Labs/procedures today:   Orders Placed This Encounter  Procedures   MM Digital Screening   Follow-up: Return in about 1 year (around 03/31/2024) for annual exam or sooner as needed.  Lennart Pall, MD 04/01/2023 12:18 PM

## 2023-04-01 NOTE — Patient Instructions (Signed)
 It was nice meeting you today. You will see your results in the MyChart app within 1 week.

## 2023-04-02 ENCOUNTER — Encounter: Payer: Self-pay | Admitting: Obstetrics and Gynecology

## 2023-04-02 LAB — CYTOLOGY - PAP
Comment: NEGATIVE
Diagnosis: NEGATIVE
High risk HPV: NEGATIVE

## 2023-04-07 ENCOUNTER — Ambulatory Visit: Payer: 59 | Admitting: Sports Medicine

## 2023-04-07 ENCOUNTER — Encounter: Payer: Self-pay | Admitting: Sports Medicine

## 2023-04-07 DIAGNOSIS — M25512 Pain in left shoulder: Secondary | ICD-10-CM | POA: Diagnosis not present

## 2023-04-07 DIAGNOSIS — H01024 Squamous blepharitis left upper eyelid: Secondary | ICD-10-CM | POA: Diagnosis not present

## 2023-04-07 DIAGNOSIS — G8929 Other chronic pain: Secondary | ICD-10-CM

## 2023-04-07 DIAGNOSIS — M170 Bilateral primary osteoarthritis of knee: Secondary | ICD-10-CM

## 2023-04-07 DIAGNOSIS — H01021 Squamous blepharitis right upper eyelid: Secondary | ICD-10-CM | POA: Diagnosis not present

## 2023-04-07 NOTE — Progress Notes (Signed)
    Procedures performed today:    None.  Independent interpretation of notes and tests performed by another provider:   None.  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 If this recurs and hydrocortisone not effective we can switch to something like [place]. Return as needed.  Chronic left shoulder pain Several months of discomfort left shoulder worse with internal rotation, benign exam with the exception of some subscapularis dysfunction, she had had 2 months of home therapy, we added meloxicam, it seems that we missed getting her home physical therapy, overall she feels about the same. Adding her home PT, she will do this for 6 weeks and if not significantly better we can do a subcoracoid space injection.  Primary osteoarthritis of both knees Bilateral knee osteoarthritis, right knee injected several years ago, left knee back in May 2024, she had a recurrence of left knee pain. At the last visit we did a steroid injection and had an MRI, MRI really did not show any meniscal pathology, mostly just osteoarthritis. She is doing really well today, adding some home knee conditioning, she can return to see me as needed.    ____________________________________________ Ihor Austin. Benjamin Stain, M.D., ABFM., CAQSM., AME. Primary Care and Sports Medicine [facility]  Adjunct Professor of Family Medicine  [place] [employer]  Restaurant manager, fast food

## 2023-04-07 NOTE — Assessment & Plan Note (Signed)
 Resolved with low potency hydrocortisone. If this recurs and hydrocortisone not effective we can switch to something like [place]. Return as needed.

## 2023-04-07 NOTE — Assessment & Plan Note (Signed)
 Several months of discomfort left shoulder worse with internal rotation, benign exam with the exception of some subscapularis dysfunction, she had had 2 months of home therapy, we added meloxicam, it seems that we missed getting her home physical therapy, overall she feels about the same. Adding her home PT, she will do this for 6 weeks and if not significantly better we can do a subcoracoid space injection.

## 2023-04-07 NOTE — Assessment & Plan Note (Signed)
 Bilateral knee osteoarthritis, right knee injected several years ago, left knee back in May 2024, she had a recurrence of left knee pain. At the last visit we did a steroid injection and had an MRI, MRI really did not show any meniscal pathology, mostly just osteoarthritis. She is doing really well today, adding some home knee conditioning, she can return to see me as needed.

## 2023-05-13 ENCOUNTER — Ambulatory Visit: Payer: 59

## 2023-10-06 ENCOUNTER — Encounter: Payer: Self-pay | Admitting: Sports Medicine

## 2023-11-19 ENCOUNTER — Ambulatory Visit (INDEPENDENT_AMBULATORY_CARE_PROVIDER_SITE_OTHER): Admitting: Family Medicine

## 2023-11-19 ENCOUNTER — Encounter: Payer: Self-pay | Admitting: Family Medicine

## 2023-11-19 VITALS — BP 124/73 | HR 87 | Ht 64.0 in | Wt 168.1 lb

## 2023-11-19 DIAGNOSIS — R1030 Lower abdominal pain, unspecified: Secondary | ICD-10-CM

## 2023-11-19 DIAGNOSIS — Z23 Encounter for immunization: Secondary | ICD-10-CM

## 2023-11-19 DIAGNOSIS — Z0000 Encounter for general adult medical examination without abnormal findings: Secondary | ICD-10-CM

## 2023-11-19 NOTE — Progress Notes (Signed)
 Complete physical exam  Patient: Colleen [name]   DOB: [birth year]   53 y.o. Female  MRN: [id number]  Subjective:    Chief Complaint  Patient presents with   Annual Exam    Colleen [name] is a 53 y.o. female who presents today for a complete physical exam. She reports consuming a general diet. Some exercise in the pool. She generally feels well. She does not have additional problems to discuss today.    Most recent fall risk assessment:    11/19/2023    2:47 PM  Fall Risk   Falls in the past year? 0  Number falls in past yr: 0  Injury with Fall? 0  Risk for fall due to : No Fall Risks  Follow up Falls evaluation completed     Most recent depression screenings:    11/19/2023    2:47 PM 06/24/2022   11:22 AM  PHQ 2/9 Scores  PHQ - 2 Score 0 0         Patient Care Team: Alvan Dorothyann BIRCH, MD as PCP - General (Family Medicine) Brien Belvie BRAVO, MD as Attending Physician (Pulmonary Disease) Rosalynn LELON Ingle, MD (Inactive) as Consulting Physician (Obstetrics and Gynecology)   Outpatient Medications Prior to Visit  Medication Sig   folic acid (FOLVITE) 1 MG tablet Take 1 mg by mouth daily.   iron polysaccharides (NIFEREX) 150 MG capsule TAKE ONE CAPSULE BY MOUTH 2 TIMES DAILY.   levonorgestrel (MIRENA) 20 MCG/24HR IUD 1 each by Intrauterine route once.   Multiple Vitamins-Minerals (THERA-M) TABS Take by mouth.   Vitamin D, Ergocalciferol, (DRISDOL) 1.25 MG (50000 UNIT) CAPS capsule Take 50,000 Units by mouth once a week.   [DISCONTINUED] hydrocortisone  2.5 % ointment Once daily   [DISCONTINUED] triamcinolone  ointment (KENALOG ) 0.5 % APPLY TO AFFECTED AREA TWICE A DAY   No facility-administered medications prior to visit.    ROS        Objective:     BP 124/73   Pulse 87   Ht 5' 4 (1.626 m)   Wt 168 lb 1.6 oz (76.2 kg)   SpO2 97%   BMI 28.85 kg/m     Physical Exam Vitals and nursing note reviewed.  Constitutional:      Appearance:  Normal appearance.  HENT:     Head: Normocephalic and atraumatic.  Eyes:     Conjunctiva/sclera: Conjunctivae normal.  Cardiovascular:     Rate and Rhythm: Normal rate and regular rhythm.  Pulmonary:     Effort: Pulmonary effort is normal.     Breath sounds: Normal breath sounds.  Skin:    General: Skin is warm and dry.  Neurological:     Mental Status: She is alert.  Psychiatric:        Mood and Affect: Mood normal.      No results found for any visits on 11/19/23.      Assessment & Plan:    Routine Health Maintenance and Physical Exam  Immunization History  Administered Date(s) Administered   H1N1 02/02/2008   Hepatitis B, ADULT 01/20/2019, 02/21/2019, 07/20/2019   Influenza Whole 12/02/2006, 02/02/2008   Influenza, Seasonal, Injecte, Preservative Fre 11/19/2023   Influenza,inj,Quad PF,6+ Mos 09/30/2013, 11/15/2015   PNEUMOCOCCAL CONJUGATE-20 11/19/2023   Tdap 06/02/2011, 09/24/2022   Unspecified SARS-COV-2 Vaccination 03/21/2019, 05/01/2019    Health Maintenance  Topic Date Due   Mammogram  04/14/2018   Zoster Vaccines- Shingrix (1 of 2) Never done   COVID-19 Vaccine (3 - 2025-26 season)  10/05/2023   Cervical Cancer Screening (HPV/Pap Cotest)  03/31/2028   Colonoscopy  04/26/2030   DTaP/Tdap/Td (3 - Td or Tdap) 09/23/2032   Pneumococcal Vaccine: 50+ Years  Completed   Influenza Vaccine  Completed   Hepatitis B Vaccines 19-59 Average Risk  Completed   Hepatitis C Screening  Completed   HIV Screening  Completed   HPV VACCINES  Aged Out   Meningococcal B Vaccine  Aged Out    Discussed health benefits of physical activity, and encouraged her to engage in regular exercise appropriate for her age and condition.  Problem List Items Addressed This Visit   None Visit Diagnoses       Encounter for immunization    -  Primary   Relevant Orders   Flu vaccine trivalent PF, 6mos and older(Flulaval,Afluria,Fluarix,Fluzone) (Completed)   Pneumococcal conjugate  vaccine 20-valent (Completed)     Wellness examination       Relevant Orders   Ambulatory referral to Obstetrics / Gynecology     Lower abdominal pain       Relevant Orders   US  Pelvic Complete With Transvaginal      Lower abd pain -scar looks great on exam no palpable firmness.  Bowels are moving normally.  So recommend further workup with pelvic ultrasound.  This may just be the new normal for her since she is also has some decrease sensitivity from the surgery in that area.  Given Prevnar 20 today also encouraged her to get her shingles vaccine she will make a separate nurse appointment for that.  Given flu shot today as well.  Will place new referral to get her in with an OB/GYN for February 2026.  Return in about 1 year (around 11/18/2024) for Wellness Exam.     Dorothyann Byars, MD

## 2023-11-27 ENCOUNTER — Other Ambulatory Visit

## 2023-11-27 DIAGNOSIS — R1030 Lower abdominal pain, unspecified: Secondary | ICD-10-CM

## 2023-12-01 ENCOUNTER — Ambulatory Visit: Payer: Self-pay | Admitting: Family Medicine

## 2023-12-01 NOTE — Progress Notes (Signed)
 Ultrasound shows no acute findings which is wonderful.  IUD is present.  They were not able to get a good look at the right ovary but did not see any cysts.  No concerning findings which is wonderful.

## 2024-04-04 ENCOUNTER — Encounter: Admitting: Family Medicine

## 2024-11-21 ENCOUNTER — Encounter: Admitting: Family Medicine
